# Patient Record
Sex: Male | Born: 1995
Health system: Southern US, Community
[De-identification: ages and names within clinical notes are randomized; demographics above are authoritative.]

## PROBLEM LIST (undated history)

## (undated) ENCOUNTER — Emergency Department (HOSPITAL_COMMUNITY): Payer: No Typology Code available for payment source

## (undated) HISTORY — DX: Morbid (severe) obesity due to excess calories: E66.01

---

## 2014-03-12 ENCOUNTER — Emergency Department: Payer: Self-pay | Admitting: Emergency Medicine

## 2015-04-11 ENCOUNTER — Emergency Department
Admission: EM | Admit: 2015-04-11 | Discharge: 2015-04-11 | Disposition: A | Discharge status: Home / Self Care | Payer: Self-pay | Attending: Student | Admitting: Student

## 2015-04-11 DIAGNOSIS — B081 Molluscum contagiosum: Secondary | ICD-10-CM | POA: Insufficient documentation

## 2015-04-11 NOTE — Discharge Instructions (Signed)
As discussed avoid scratching the areas. Follow up with your primary care physician or dermatology as needed. See above to call to schedule. Return to ER for new or worsening concerns.  Molluscum Contagiosum Molluscum contagiosum is a viral infection of the skin that causes smooth surfaced, firm, small (3 to 5 mm), dome-shaped bumps (papules) which are flesh-colored. The bumps usually do not hurt or itch. In children, they most often appear on the face, trunk, arms and legs. In adults, the growths are commonly found on the genitals, thighs, face, arms, neck, and belly (abdomen). The infection may be spread to others by close (skin to skin) contact (such as occurs in schools and swimming pools), sharing towels and clothing, and through sexual contact. The bumps usually disappear without treatment in 2 to 4 months, especially in children. You may have them treated to avoid spreading them. Scraping (curetting) the middle part (central plug) of the bump with a needle or sharp curette, or application of liquid nitrogen for 8 or 9 seconds usually cures the infection. HOME CARE INSTRUCTIONS   Do not scratch the bumps. This may spread the infection to other parts of the body and to other people.  Avoid close contact with others, including sexual contact, until the bumps disappear. Do not share towels or clothing.  If liquid nitrogen was used, blisters will form. Leave the blisters alone and cover with a bandage. The tops will fall off by themselves in 7 to 14 days.  Four months without a lesion is usually a cure. SEEK IMMEDIATE MEDICAL CARE IF:  You have a fever.  You develop swelling, redness, pain, tenderness, or warmth in the areas of the bumps. They may be infected. Document Released: 09/09/2000 Document Revised: 12/05/2011 Document Reviewed: 02/20/2009 [facility] Patient Information 2015 [place]. This information is not intended to replace advice given to you by your health care provider. Make  sure you discuss any questions you have with your health care provider.

## 2015-04-11 NOTE — ED Provider Notes (Signed)
[facility] Emergency Department Provider Note  ____________________________________________  Time seen: Approximately 12:05 PM  I have reviewed the triage vital signs and the nursing notes.   HISTORY  Chief Complaint rash   HPI Frederick Schmidt is a 19 y.o. male patient presents to ER for the complaints of rash to bilateral forearms for 2-3 weeks. Patient reports that he thought he may have been exposed to something at work as he is frequently around dust and boxes. Patient denies particular onset or trigger. Patient reports that rash started as one to 2 spots on right forearm and then has very slowly progressed. Patient states that the areas do not itch. Denies itching, pain, swelling, irritation. Patient states that "I don't even known that they're there except for that I see them. "Denies others around him with similar.  Patient denies recent change in jobs. Patient states that he has been doing the same job and same exposures for several years. Patient denies changes in foods, medicines, lotions, detergents or other contacts. Denies fevers.    No past medical history on file.  There are no active problems to display for this patient.   No past surgical history on file.  No current outpatient prescriptions on file.  Allergies Review of patient's allergies indicates no known allergies.  No family history on file.  Social History History  Substance Use Topics  . Smoking status: Not on file  . Smokeless tobacco: Not on file  . Alcohol Use: Not on file    Review of Systems Constitutional: No fever/chills Eyes: No visual changes. ENT: No sore throat. Cardiovascular: Denies chest pain. Respiratory: Denies shortness of breath. Gastrointestinal: No abdominal pain.  No nausea, no vomiting.  No diarrhea.  No constipation. Genitourinary: Negative for dysuria. Musculoskeletal: Negative for back pain. Skin: positive for rash. Neurological: Negative for  headaches, focal weakness or numbness.  10-point ROS otherwise negative.  ____________________________________________   PHYSICAL EXAM:  VITAL SIGNS: ED Triage Vitals  Enc Vitals Group     BP --      Pulse -- 88     Resp -- 18     Temp --      Temp src --      SpO2 --      Weight --      Height --      Head Cir --      Peak Flow --      Pain Score --      Pain Loc --      Pain Edu? --      Excl. in GC? --     Constitutional: Alert and oriented. Well appearing and in no acute distress. Eyes: Conjunctivae are normal. PERRL. EOMI. Head: Atraumatic. Nose: No congestion/rhinnorhea. Mouth/Throat: Mucous membranes are moist.  Oropharynx non-erythematous. Neck: No stridor. No cervical spine tenderness to palpation. Hematological/Lymphatic/Immunilogical: No cervical lymphadenopathy. Cardiovascular: Normal rate, regular rhythm. Grossly normal heart sounds.  Good peripheral circulation. Respiratory: Normal respiratory effort.  No retractions. Lungs CTAB. Gastrointestinal: Soft and nontender. No distention. Normal bowel sounds.  Musculoskeletal: No lower or upper extremity tenderness nor edema.  Neurologic:  Normal speech and language. No gross focal neurologic deficits are appreciated. No gait instability. Skin:  Skin is warm, dry and intact. No rash noted. Except:  Bilateral forearms with multiple small firm skin colored dome shaped shiny papules with center indentation. No erythema. No surrounding erythema. No surrounding induration, fluctuance. Nontender. No drainage. Non pruritic. Psychiatric: Mood and affect are normal.  Speech and behavior are normal.  ____________________________________________   LABS (all labs ordered are listed, but only abnormal results are displayed)  Labs Reviewed - No data to display ___________________________________________   INITIAL IMPRESSION / ASSESSMENT AND PLAN / ED COURSE  Pertinent labs & imaging results that were available during  my care of the patient were reviewed by me and considered in my medical decision making (see chart for details).  No acute distress. Very well-appearing patient. Presents the ER for complaints of bilateral forearm rash times several weeks. Patient unsure of trigger. Denies changes of contacts or exposures. Patient bilateral forearm rash appearance consistent with bilateral forearm molluscum contagiosum. Discussed supportive treatment. Discussed avoidance of scratching. Discussed with patient and family to follow up with primary care physician or dermatology as needed. Information also given. Discussed return parameters. Discussed follow-up and return parameters. Patient and spouse verbalized understanding and agreed to plan. ____________________________________________   FINAL CLINICAL IMPRESSION(S) / ED DIAGNOSES  Final diagnoses:  Molluscum contagiosum infection      Renford Dills, NP 04/11/15 1939  Gayla Doss, MD 04/13/15 [phone]

## 2015-04-11 NOTE — ED Notes (Signed)
Patient to ED with c/o rash to bilateral arms for about 2 weeks, thinks he may have hives.

## 2016-08-04 ENCOUNTER — Encounter (HOSPITAL_COMMUNITY): Payer: Self-pay | Admitting: *Deleted

## 2016-08-04 ENCOUNTER — Emergency Department (HOSPITAL_COMMUNITY): Payer: Self-pay

## 2016-08-04 ENCOUNTER — Emergency Department (HOSPITAL_COMMUNITY)
Admission: EM | Admit: 2016-08-04 | Discharge: 2016-08-04 | Disposition: A | Discharge status: Home / Self Care | Payer: Self-pay | Attending: Emergency Medicine | Admitting: Emergency Medicine

## 2016-08-04 DIAGNOSIS — S93402A Sprain of unspecified ligament of left ankle, initial encounter: Secondary | ICD-10-CM | POA: Insufficient documentation

## 2016-08-04 DIAGNOSIS — Y929 Unspecified place or not applicable: Secondary | ICD-10-CM | POA: Insufficient documentation

## 2016-08-04 DIAGNOSIS — Y939 Activity, unspecified: Secondary | ICD-10-CM | POA: Insufficient documentation

## 2016-08-04 DIAGNOSIS — Y990 Civilian activity done for income or pay: Secondary | ICD-10-CM | POA: Insufficient documentation

## 2016-08-04 DIAGNOSIS — X509XXA Other and unspecified overexertion or strenuous movements or postures, initial encounter: Secondary | ICD-10-CM | POA: Insufficient documentation

## 2016-08-04 NOTE — ED Notes (Signed)
Patient transported to X-ray 

## 2016-08-04 NOTE — ED Notes (Signed)
Pt is in stable condition upon d/c and ambulates from ED. coupon given.

## 2016-08-04 NOTE — ED Triage Notes (Signed)
Pt states he slipped at work yesterday and then fell off his stairs this morning and twisted his ankle. There is edema noted to left ankle. Pt states when resting he has no pain, but endorses severe pain with ambulation.

## 2016-08-04 NOTE — ED Provider Notes (Signed)
[facility]-EMERGENCY DEPT Provider Note   CSN: [id number] Arrival date & time: 08/04/16  1040  By signing my name below, I, Frederick Schmidt, attest that this documentation has been prepared under the direction and in the presence of Newell RubbermaidJeffrey Samuell Knoble, PA-C. Electronically Signed: Sonum Schmidt, Neurosurgeoncribe. 08/04/16. 10:56 AM.  History   Chief Complaint Chief Complaint  Patient presents with  . Ankle Pain    The history is provided by the patient. No language interpreter was used.     HPI Comments: Frederick Schmidt is a 20 y.o. male who presents to the Emergency Department complaining of a left ankle and foot pain that began after an injury yesterday at work. Patient states he slipped at work and twisted/rotated the left lower extremity. He states he was fine afterwards but then re-injured the same area yesterday while walking his dog. He states the pain is worse with ambulation.    History reviewed. No pertinent past medical history.  There are no active problems to display for this patient.   History reviewed. No pertinent surgical history.     Home Medications    Prior to Admission medications   Not on File    Family History No family history on file.  Social History Social History  Substance Use Topics  . Smoking status: Not on file  . Smokeless tobacco: Not on file  . Alcohol use Not on file     Allergies   Patient has no known allergies.   Review of Systems Review of Systems  A complete 10 system review of systems was obtained and all systems are negative except as noted in the HPI and PMH.   Physical Exam Updated Vital Signs BP 141/62 (BP Location: Right Arm)   Pulse 71   Temp 98.4 F (36.9 C) (Oral)   Resp 16   SpO2 100%   Physical Exam  Constitutional: He is oriented to person, place, and time. He appears well-developed and well-nourished.  HENT:  Head: Normocephalic and atraumatic.  Cardiovascular: Normal rate and intact distal pulses.   Pulmonary/Chest:  Effort normal.  Musculoskeletal: He exhibits edema and tenderness.  LLE: Obvious swelling to lateral left ankle. Tenderness to lateral malleolus. Tenderness to medial malleolus and proximal foot. Sensation intact. Pedal pulses 2+. No obvious laxity. No pain to remainder of leg  Neurological: He is alert and oriented to person, place, and time. No sensory deficit.  Skin: Skin is warm and dry.  Psychiatric: He has a normal mood and affect.  Nursing note and vitals reviewed.    ED Treatments / Results  DIAGNOSTIC STUDIES: Oxygen Saturation is 100% on RA, normal by my interpretation.    COORDINATION OF CARE: 10:56 AM Discussed treatment plan with pt at bedside and pt agreed to plan.    Labs (all labs ordered are listed, but only abnormal results are displayed) Labs Reviewed - No data to display  EKG  EKG Interpretation None       Radiology Dg Ankle Complete Left  Result Date: 08/04/2016 CLINICAL DATA:  Slipped yesterday twisting the left ankle and foot EXAM: LEFT ANKLE COMPLETE - 3+ VIEW COMPARISON:  None. FINDINGS: The ankle joint appears normal. Alignment is normal. No fracture is seen. IMPRESSION: Negative. Electronically Signed   By: Dwyane DeePaul  Barry M.D.   On: 08/04/2016 11:22   Dg Foot Complete Left  Result Date: 08/04/2016 CLINICAL DATA:  Slipped yesterday twisting the left foot and ankle with pain EXAM: LEFT FOOT - COMPLETE 3+ VIEW COMPARISON:  None. FINDINGS:  Tarsal-metatarsal alignment is normal. No fracture is seen. Joint spaces appear normal. IMPRESSION: Negative. Electronically Signed   By: Dwyane DeePaul  Barry M.D.   On: 08/04/2016 11:23    Procedures Procedures (including critical care time)  Medications Ordered in ED Medications - No data to display   Initial Impression / Assessment and Plan / ED Course  I have reviewed the triage vital signs and the nursing notes.  Pertinent labs & imaging results that were available during my care of the patient were reviewed by me  and considered in my medical decision making (see chart for details).  Clinical Course     Labs:  Imaging:  Consults:  Therapeutics:  Discharge Meds:   Assessment/Plan:  Patient presents with a sprain. She notes no significant laxity, he will be placed in a sewing given crutches. Negative plain films. Discharged home with symptomatic care instructions and follow-up information. He verbalizes understanding and agreement to today's plan Final Clinical Impressions(s) / ED Diagnoses   Final diagnoses:  Sprain of left ankle, unspecified ligament, initial encounter    New Prescriptions There are no discharge medications for this patient.  I personally performed the services described in this documentation, which was scribed in my presence. The recorded information has been reviewed and is accurate.    Eyvonne MechanicJeffrey Kymari Lollis, PA-C 08/04/16 1302    Benjiman CoreNathan Pickering, MD 08/05/16 [phone]

## 2016-08-04 NOTE — Discharge Instructions (Signed)
Please read attached information. If you experience any new or worsening signs or symptoms please return to the emergency room for evaluation. Please follow-up with your primary care provider or specialist as discussed.  °

## 2017-01-18 ENCOUNTER — Encounter (HOSPITAL_COMMUNITY): Payer: Self-pay | Admitting: Emergency Medicine

## 2017-01-18 ENCOUNTER — Emergency Department (HOSPITAL_COMMUNITY)
Admission: EM | Admit: 2017-01-18 | Discharge: 2017-01-18 | Disposition: A | Discharge status: Home / Self Care | Payer: BLUE CROSS/BLUE SHIELD | Attending: Emergency Medicine | Admitting: Emergency Medicine

## 2017-01-18 DIAGNOSIS — R319 Hematuria, unspecified: Secondary | ICD-10-CM | POA: Diagnosis not present

## 2017-01-18 DIAGNOSIS — Z87891 Personal history of nicotine dependence: Secondary | ICD-10-CM | POA: Diagnosis not present

## 2017-01-18 DIAGNOSIS — Z113 Encounter for screening for infections with a predominantly sexual mode of transmission: Secondary | ICD-10-CM | POA: Diagnosis not present

## 2017-01-18 LAB — CBC WITH DIFFERENTIAL/PLATELET
Basophils Absolute: 0 10*3/uL (ref 0.0–0.1)
Basophils Relative: 0 %
EOS ABS: 0.2 10*3/uL (ref 0.0–0.7)
Eosinophils Relative: 4 %
HCT: 44.4 % (ref 39.0–52.0)
HEMOGLOBIN: 14.6 g/dL (ref 13.0–17.0)
LYMPHS ABS: 1.9 10*3/uL (ref 0.7–4.0)
Lymphocytes Relative: 35 %
MCH: 27.2 pg (ref 26.0–34.0)
MCHC: 32.9 g/dL (ref 30.0–36.0)
MCV: 82.8 fL (ref 78.0–100.0)
MONOS PCT: 7 %
Monocytes Absolute: 0.4 10*3/uL (ref 0.1–1.0)
NEUTROS PCT: 54 %
Neutro Abs: 2.9 10*3/uL (ref 1.7–7.7)
Platelets: 239 10*3/uL (ref 150–400)
RBC: 5.36 MIL/uL (ref 4.22–5.81)
RDW: 14.2 % (ref 11.5–15.5)
WBC: 5.5 10*3/uL (ref 4.0–10.5)

## 2017-01-18 LAB — COMPREHENSIVE METABOLIC PANEL
ALK PHOS: 44 U/L (ref 38–126)
ALT: 47 U/L (ref 17–63)
ANION GAP: 6 (ref 5–15)
AST: 86 U/L — ABNORMAL HIGH (ref 15–41)
Albumin: 3.8 g/dL (ref 3.5–5.0)
BILIRUBIN TOTAL: 0.8 mg/dL (ref 0.3–1.2)
BUN: 9 mg/dL (ref 6–20)
CO2: 26 mmol/L (ref 22–32)
Calcium: 9.1 mg/dL (ref 8.9–10.3)
Chloride: 108 mmol/L (ref 101–111)
Creatinine, Ser: 1.27 mg/dL — ABNORMAL HIGH (ref 0.61–1.24)
GFR calc non Af Amer: >60 mL/min (ref >60)
GLUCOSE: 74 mg/dL (ref 65–99)
POTASSIUM: 3.8 mmol/L (ref 3.5–5.1)
SODIUM: 140 mmol/L (ref 135–145)
TOTAL PROTEIN: 6.7 g/dL (ref 6.5–8.1)

## 2017-01-18 LAB — URINALYSIS, ROUTINE W REFLEX MICROSCOPIC
BILIRUBIN URINE: NEGATIVE
GLUCOSE, UA: NEGATIVE mg/dL
HGB URINE DIPSTICK: NEGATIVE
Ketones, ur: NEGATIVE mg/dL
Leukocytes, UA: NEGATIVE
NITRITE: NEGATIVE
PH: 6 (ref 5.0–8.0)
Protein, ur: NEGATIVE mg/dL
SPECIFIC GRAVITY, URINE: 1.023 (ref 1.005–1.030)

## 2017-01-18 MED ORDER — ACETAMINOPHEN 500 MG PO TABS
500.0000 mg | ORAL_TABLET | Freq: Once | ORAL | Status: AC
  Administered 2017-01-18: 500 mg via ORAL
  Filled 2017-01-18: qty 1

## 2017-01-18 NOTE — ED Triage Notes (Addendum)
Pt to ED with c/o seeing blood in his urine this am.  Also st's he has a rash in his private area.  Pt c/o headache and being thirsty all the time

## 2017-01-18 NOTE — Discharge Instructions (Signed)
Log on to the Patient portal in 48hrs to see the results of your test.

## 2017-01-18 NOTE — ED Provider Notes (Signed)
[facility] Provider Note   CSN: [id number] Arrival date & time: 01/18/17  1630     History   Chief Complaint Chief Complaint  Patient presents with  . Hematuria  . Headache    HPI Frederick Schmidt is a 21 y.o. male.  The history is provided by the patient.  Male GU Problem  Primary symptoms include dysuria, genital itching, genital rash, penile pain.  Primary symptoms include no penile discharge, no scrotal pain. Primary symptoms comment: Painless hematuria. This is a new problem. The current episode started 2 days ago. The problem occurs constantly. The problem has not changed since onset.Context: Noticed the hematuria today, so came in for evaluation. Pertinent negatives include no nausea, no vomiting, no abdominal pain and no frequency. There has been no fever. He has tried nothing for the symptoms. Sexual activity: sexually active. He consistently uses condoms. Partner displays symptoms of an STD: no.    History reviewed. No pertinent past medical history.  There are no active problems to display for this patient.   History reviewed. No pertinent surgical history.     Home Medications    Prior to Admission medications   Not on File    Family History No family history on file.  Social History Social History  Substance Use Topics  . Smoking status: Former Games developer  . Smokeless tobacco: Never Used  . Alcohol use Yes     Allergies   Patient has no known allergies.   Review of Systems Review of Systems  Constitutional: Negative for chills and fever.  HENT: Negative for ear pain and sore throat.   Eyes: Negative for pain and visual disturbance.  Respiratory: Negative for cough and shortness of breath.   Cardiovascular: Negative for chest pain and palpitations.  Gastrointestinal: Negative for abdominal pain, nausea and vomiting.  Genitourinary: Positive for dysuria, hematuria and penile pain. Negative for discharge, frequency, penile discharge, penile  swelling, scrotal swelling and testicular pain.  Musculoskeletal: Negative for arthralgias and back pain.  Skin: Negative for color change and rash.  Neurological: Negative for seizures and syncope.  All other systems reviewed and are negative.    Physical Exam Updated Vital Signs BP (!) 150/74   Pulse 62   Temp 98.6 F (37 C) (Oral)   Resp 16   Ht 5' 10.5" (1.791 m)   Wt 127 kg   SpO2 98%   BMI 39.61 kg/m   Physical Exam  Constitutional: He appears well-developed and well-nourished.  HENT:  Head: Normocephalic and atraumatic.  Eyes: Conjunctivae are normal.  Neck: Neck supple.  Cardiovascular: Normal rate and regular rhythm.   No murmur heard. Pulmonary/Chest: Effort normal and breath sounds normal. No respiratory distress.  Abdominal: Soft. There is no tenderness.  Genitourinary: Penis normal.  Genitourinary Comments: TTP over a hair follicle w/o erythema, edema, or other signs of associated infection, no lesions noted on exam, no penile drainage, symmetric testicles w/o tenderenss, no hernias  Musculoskeletal: He exhibits no edema.  Neurological: He is alert.  Skin: Skin is warm and dry.  Psychiatric: He has a normal mood and affect.  Nursing note and vitals reviewed.    ED Treatments / Results  Labs (all labs ordered are listed, but only abnormal results are displayed) Labs Reviewed  COMPREHENSIVE METABOLIC PANEL - Abnormal; Notable for the following:       Result Value   Creatinine, Ser 1.27 (*)    AST 86 (*)    All other components within normal limits  URINALYSIS, ROUTINE W REFLEX MICROSCOPIC  CBC WITH DIFFERENTIAL/PLATELET    EKG  EKG Interpretation None       Radiology No results found.  Procedures Procedures (including critical care time)  Medications Ordered in ED Medications - No data to display   Initial Impression / Assessment and Plan / ED Course  I have reviewed the triage vital signs and the nursing notes.  Pertinent labs &  imaging results that were available during my care of the patient were reviewed by me and considered in my medical decision making (see chart for details).    Pt presents with hematuria. Says he noticed blood in his urine once today; he notes that he's had intermittent pain w/urination for months but says he didn't have any pain today. He also mentions a "rash" on his genitals that has been present for the last 2days; he says he's only had sex w/his girlfriend recently & used a condom. Denies F/C, scrotal pain, penile swelling, discharge, N/V.  VS & exam as above. UA w/o frank or microscopic hematuria here. Crt 1.26, but no labs available for comparisonNo obvious signs of STI on exam. Screening swab for GC/CT collected; will not treat empirically. Cause of he Pt's symptoms unclear, but doubt emergent etiology given history, exam, and workup.  Explained all results to the Pt. Will discharge the Pt home. Recommending follow-up with PCP & Urology if hematuria recurs. ED return precautions provided. Pt acknowledged understanding of, and concurrence with the plan. All questions answered to his satisfaction. In stable condition at the time of discharge.  Final Clinical Impressions(s) / ED Diagnoses   Final diagnoses:  Hematuria, unspecified type  Routine screening for STI (sexually transmitted infection)    New Prescriptions New Prescriptions   No medications on file     Forest Becker, MD 01/19/17 0011    Lavera Guise, MD 01/19/17 [phone]

## 2017-01-20 LAB — GC/CHLAMYDIA PROBE AMP (~~LOC~~) NOT AT ARMC
Chlamydia: NEGATIVE
Neisseria Gonorrhea: NEGATIVE

## 2017-04-13 ENCOUNTER — Encounter (HOSPITAL_COMMUNITY): Payer: Self-pay | Admitting: Emergency Medicine

## 2017-04-13 ENCOUNTER — Emergency Department (HOSPITAL_COMMUNITY): Payer: BLUE CROSS/BLUE SHIELD

## 2017-04-13 ENCOUNTER — Emergency Department (HOSPITAL_COMMUNITY)
Admission: EM | Admit: 2017-04-13 | Discharge: 2017-04-14 | Disposition: A | Discharge status: Home / Self Care | Payer: BLUE CROSS/BLUE SHIELD | Attending: Emergency Medicine | Admitting: Emergency Medicine

## 2017-04-13 DIAGNOSIS — M545 Low back pain, unspecified: Secondary | ICD-10-CM

## 2017-04-13 DIAGNOSIS — Z87891 Personal history of nicotine dependence: Secondary | ICD-10-CM | POA: Insufficient documentation

## 2017-04-13 DIAGNOSIS — M546 Pain in thoracic spine: Secondary | ICD-10-CM

## 2017-04-13 DIAGNOSIS — Y998 Other external cause status: Secondary | ICD-10-CM | POA: Diagnosis not present

## 2017-04-13 DIAGNOSIS — Y92410 Unspecified street and highway as the place of occurrence of the external cause: Secondary | ICD-10-CM | POA: Diagnosis not present

## 2017-04-13 DIAGNOSIS — M549 Dorsalgia, unspecified: Secondary | ICD-10-CM | POA: Diagnosis not present

## 2017-04-13 DIAGNOSIS — Y939 Activity, unspecified: Secondary | ICD-10-CM | POA: Diagnosis not present

## 2017-04-13 MED ORDER — METHOCARBAMOL 500 MG PO TABS: 500.0000 mg | ORAL_TABLET | Freq: Two times a day (BID) | ORAL | 0 refills | Status: DC

## 2017-04-13 MED ORDER — IBUPROFEN 800 MG PO TABS: 800.0000 mg | ORAL_TABLET | Freq: Three times a day (TID) | ORAL | 0 refills | Status: DC
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
## 2017-04-13 NOTE — ED Notes (Addendum)
Pt updated on plan of care and delay. Pt verbalized understanding.
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
## 2017-04-13 NOTE — Discharge Instructions (Signed)
Please read attached information. If you experience any new or worsening signs or symptoms please return to the emergency room for evaluation. Please follow-up with your primary care provider or specialist as discussed. Please use medication prescribed only as directed and discontinue taking if you have any concerning signs or symptoms.   °

## 2017-04-13 NOTE — ED Notes (Signed)
Patient transported to X-ray 
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
## 2017-09-07 ENCOUNTER — Other Ambulatory Visit: Payer: Self-pay

## 2017-09-07 ENCOUNTER — Encounter (HOSPITAL_COMMUNITY): Payer: Self-pay | Admitting: Emergency Medicine

## 2017-09-07 ENCOUNTER — Emergency Department (HOSPITAL_COMMUNITY)
Admission: EM | Admit: 2017-09-07 | Discharge: 2017-09-07 | Disposition: A | Discharge status: Home / Self Care | Payer: BLUE CROSS/BLUE SHIELD | Attending: Emergency Medicine | Admitting: Emergency Medicine

## 2017-09-07 DIAGNOSIS — Z5321 Procedure and treatment not carried out due to patient leaving prior to being seen by health care provider: Secondary | ICD-10-CM | POA: Diagnosis not present

## 2017-09-07 DIAGNOSIS — R51 Headache: Secondary | ICD-10-CM | POA: Diagnosis not present

## 2017-09-07 LAB — COMPREHENSIVE METABOLIC PANEL
ALK PHOS: 46 U/L (ref 38–126)
ALT: 31 U/L (ref 17–63)
AST: 34 U/L (ref 15–41)
Albumin: 4.1 g/dL (ref 3.5–5.0)
Anion gap: 6 (ref 5–15)
BUN: 9 mg/dL (ref 6–20)
CALCIUM: 9.1 mg/dL (ref 8.9–10.3)
CHLORIDE: 106 mmol/L (ref 101–111)
CO2: 24 mmol/L (ref 22–32)
CREATININE: 1.19 mg/dL (ref 0.61–1.24)
Glucose, Bld: 118 mg/dL — ABNORMAL HIGH (ref 65–99)
Potassium: 3.8 mmol/L (ref 3.5–5.1)
Sodium: 136 mmol/L (ref 135–145)
TOTAL PROTEIN: 7 g/dL (ref 6.5–8.1)
Total Bilirubin: 0.7 mg/dL (ref 0.3–1.2)

## 2017-09-07 LAB — URINALYSIS, ROUTINE W REFLEX MICROSCOPIC
BILIRUBIN URINE: NEGATIVE
Glucose, UA: NEGATIVE mg/dL
HGB URINE DIPSTICK: NEGATIVE
KETONES UR: NEGATIVE mg/dL
NITRITE: NEGATIVE
PROTEIN: NEGATIVE mg/dL
Specific Gravity, Urine: 1.023 (ref 1.005–1.030)
pH: 6 (ref 5.0–8.0)

## 2017-09-07 LAB — CBC
HEMATOCRIT: 43.2 % (ref 39.0–52.0)
HEMOGLOBIN: 14.5 g/dL (ref 13.0–17.0)
MCH: 27.9 pg (ref 26.0–34.0)
MCHC: 33.6 g/dL (ref 30.0–36.0)
MCV: 83.1 fL (ref 78.0–100.0)
Platelets: 240 10*3/uL (ref 150–400)
RBC: 5.2 MIL/uL (ref 4.22–5.81)
RDW: 14.3 % (ref 11.5–15.5)
WBC: 6.7 10*3/uL (ref 4.0–10.5)

## 2017-09-07 LAB — LIPASE, BLOOD: LIPASE: 31 U/L (ref 11–51)

## 2017-09-07 NOTE — ED Notes (Signed)
Pt does not answer when called in the waiting area x 3

## 2017-09-07 NOTE — ED Triage Notes (Signed)
Patient reports emesis with diarrhea and headache onset yesterday , denies fever or chills .

## 2017-10-04 ENCOUNTER — Encounter (HOSPITAL_COMMUNITY): Payer: Self-pay | Admitting: *Deleted

## 2017-10-04 ENCOUNTER — Emergency Department (HOSPITAL_COMMUNITY)
Admission: EM | Admit: 2017-10-04 | Discharge: 2017-10-04 | Disposition: A | Discharge status: Home / Self Care | Payer: Managed Care, Other (non HMO) | Attending: Emergency Medicine | Admitting: Emergency Medicine

## 2017-10-04 DIAGNOSIS — Z87891 Personal history of nicotine dependence: Secondary | ICD-10-CM | POA: Insufficient documentation

## 2017-10-04 DIAGNOSIS — L0291 Cutaneous abscess, unspecified: Secondary | ICD-10-CM

## 2017-10-04 DIAGNOSIS — Z23 Encounter for immunization: Secondary | ICD-10-CM | POA: Insufficient documentation

## 2017-10-04 DIAGNOSIS — L03116 Cellulitis of left lower limb: Secondary | ICD-10-CM

## 2017-10-04 DIAGNOSIS — Z79899 Other long term (current) drug therapy: Secondary | ICD-10-CM | POA: Insufficient documentation

## 2017-10-04 MED ORDER — DOXYCYCLINE HYCLATE 100 MG PO CAPS: 100.0000 mg | ORAL_CAPSULE | Freq: Two times a day (BID) | ORAL | 0 refills | Status: DC

## 2017-10-04 MED ORDER — LIDOCAINE-EPINEPHRINE (PF) 2 %-1:200000 IJ SOLN
10.0000 mL | Freq: Once | INTRAMUSCULAR | Status: AC
  Administered 2017-10-04: 10 mL
  Filled 2017-10-04: qty 20

## 2017-10-04 MED ORDER — IBUPROFEN 800 MG PO TABS: 800.0000 mg | ORAL_TABLET | Freq: Three times a day (TID) | ORAL | 0 refills | Status: DC

## 2017-10-04 MED ORDER — TETANUS-DIPHTH-ACELL PERTUSSIS 5-2.5-18.5 LF-MCG/0.5 IM SUSP
0.5000 mL | Freq: Once | INTRAMUSCULAR | Status: AC
  Administered 2017-10-04: 0.5 mL via INTRAMUSCULAR
  Filled 2017-10-04: qty 0.5

## 2017-10-04 NOTE — Discharge Instructions (Signed)
Please read and follow all provided instructions.  You were seen here today for an Abscess. For this, an incision and drainage (aka an I&D) to the affected area was done today. An I&D is a surgical procedure to open and drain a fluid-filled sac that may be filled with pus, mucus, or blood. Examples of fluid-filled sacs that may need surgical drainage include cysts, skin infections (abscesses), and red lumps that develop from a ruptured cyst or a small abscess (boils).  You were also found to have an area of cellulitis surrounding this for which you are being prescribed antibiotics. Please see attached handout.   Home instructions  1. Medications: Doxycycline. Please not this medication can cause light sensitivity. Please take all of your antibiotics until finished!   You may develop abdominal discomfort or diarrhea from the antibiotic.  You may help offset this with probiotics which you can buy or get in yogurt. Do not eat or take the probiotics until 2 hours after your antibiotic. Do not take your medicine if develop an itchy rash, swelling in your mouth or lips, or difficulty breathing.   2. Treatment: Keep wound clean and dry. Apply warm compresses to  throughout the day. It will continue to drain over the follow days.  For pain control you may take: 800mg  of ibuprofen (that is usually four 200mg  over the counter pills) up to 3 times a day (please take with food) and acetaminophen 975mg  (this is 3 normal strength, 325mg , over the counter pills) up to four times a day. Please do not take more than this. Do not drink alcohol or combine with other medications that have acetaminophen as an ingredient (Read the labels!).    Follow Up:  [facility] Provider [facility] wound recheck .  Return to emergency department for emergent changing or worsening symptoms.  Return instructions:  Return to the Emergency Department if you have: 1. Fever 2. You have  more redness, swelling, or pain around your incision.  3. Your incision feels warm to touch 4. Redness of the skin that moves away from the affected area, especially if it streaks away from the affected area  5. The area where the incision and drainage occurred becomes numb or it tingles. 6. Any other emergent concerns Contact a doctor if:  You have a fever.  Your symptoms do not get better after 1-2 days of treatment.  Your bone or joint under the infected area starts to hurt after the skin has healed.  Your infection comes back. This can happen in the same area or another area.  You have a swollen bump in the infected area.  You have new symptoms.  You feel ill and also have muscle aches and pains. Get help right away if:  Your symptoms get worse.  You feel very sleepy.  You throw up (vomit) or have watery poop (diarrhea) for a long time.  There are red streaks coming from the infected area.  Your red area gets larger.  Your red area turns darker.  Your vital signs today were: BP (!) 157/88 (BP Location: Right Arm)    Pulse 66    Temp 98.4 F (36.9 C) (Oral)    Resp 14    SpO2 100%  If your blood pressure (BP) was elevated above 135/85 this visit, please have this repeated by your doctor within one month. ---------------

## 2017-10-04 NOTE — ED Triage Notes (Signed)
To ED for eval of abscess to left upper thigh for past 2 days. States it started as a pimple and just got worse. States he had same on arm that resolved on own. Drainage, swelling, and redness noted.

## 2017-10-04 NOTE — ED Provider Notes (Signed)
[facility] EMERGENCY DEPARTMENT Provider Note   CSN: [id number] Arrival date & time: 10/04/17  4098     History   Chief Complaint Chief Complaint  Patient presents with  . Abscess    HPI Frederick Schmidt is a 22 y.o. male with no significant past medical history who presents emergency department today for possible abscess.  Patient states that he believes he was bitten by a bug on his left upper leg approximately 2 days ago.  Since then the area has become more red, hot and started draining a whitish material.  He has not done in triage interventions for this.  He denies any fever, chills, IV drug use, recent tick bites, history of diabetes, chronic steroid use or other immunosuppressive medications.  HPI  History reviewed. No pertinent past medical history.  There are no active problems to display for this patient.   History reviewed. No pertinent surgical history.     Home Medications    Prior to Admission medications   Medication Sig Start Date End Date Taking? Authorizing Provider  ibuprofen (ADVIL,MOTRIN) 800 MG tablet Take 1 tablet (800 mg total) by mouth 3 (three) times daily. 04/13/17   Hedges, Tinnie Gens, PA-C  methocarbamol (ROBAXIN) 500 MG tablet Take 1 tablet (500 mg total) by mouth 2 (two) times daily. 04/13/17   Eyvonne Mechanic, PA-C    Family History No family history on file.  Social History Social History   Tobacco Use  . Smoking status: Former Games developer  . Smokeless tobacco: Never Used  Substance Use Topics  . Alcohol use: Yes  . Drug use: Yes    Types: Marijuana     Allergies   Shellfish allergy   Review of Systems Review of Systems  All other systems reviewed and are negative.    Physical Exam Updated Vital Signs BP (!) 157/88 (BP Location: Right Arm)   Pulse 66   Temp 98.4 F (36.9 C) (Oral)   Resp 14   SpO2 100%   Physical Exam  Constitutional: He appears well-developed and well-nourished.  HENT:  Head:  Normocephalic and atraumatic.  Right Ear: External ear normal.  Left Ear: External ear normal.  Eyes: Conjunctivae are normal. Right eye exhibits no discharge. Left eye exhibits no discharge. No scleral icterus.  Pulmonary/Chest: Effort normal. No respiratory distress.  Neurological: He is alert.  Skin: No pallor.  On the left inner, upper thigh there is a focal area where scab is present.  Possible fluctuance noted over this area.  Dried, whitish material from assumes prior drainage is present.  There is noted 2 cm of induration surrounding the scab.  There is 9 cm x 7 cm area of erythema, warmth overlying this.  This does not extend into the inguinal fold or to the scrotum.  Area is tender to touch.  Not out of proportion.  Psychiatric: He has a normal mood and affect.  Nursing note and vitals reviewed.    ED Treatments / Results  Labs (all labs ordered are listed, but only abnormal results are displayed) Labs Reviewed - No data to display  EKG  EKG Interpretation None       Radiology No results found.  Procedures .Marland KitchenIncision and Drainage Date/Time: 10/04/2017 10:39 AM Performed by: Jacinto Halim, PA-C Authorized by: Jacinto Halim, PA-C   Consent:    Consent obtained:  Verbal   Consent given by:  Patient   Risks discussed:  Bleeding, incomplete drainage and pain   Alternatives discussed:  No treatment Location:    Type:  Abscess   Size:  1cm   Location:  Lower extremity   Lower extremity location:  Leg   Leg location:  L upper leg Pre-procedure details:    Skin preparation:  Betadine Anesthesia (see MAR for exact dosages):    Anesthesia method:  Local infiltration   Local anesthetic:  Lidocaine 2% WITH epi Procedure type:    Complexity:  Simple Procedure details:    Incision types:  Stab incision   Scalpel blade:  11   Wound management:  Probed and deloculated   Drainage:  Purulent and bloody   Drainage amount:  Moderate   Wound treatment:  Wound left  open   Packing materials:  None Post-procedure details:    Patient tolerance of procedure:  Tolerated well, no immediate complications   (including critical care time)  Medications Ordered in ED Medications  lidocaine-EPINEPHrine (XYLOCAINE W/EPI) 2 %-1:200000 (PF) injection 10 mL (not administered)  Tdap (BOOSTRIX) injection 0.5 mL (not administered)     Initial Impression / Assessment and Plan / ED Course  I have reviewed the triage vital signs and the nursing notes.  Pertinent labs & imaging results that were available during my care of the patient were reviewed by me and considered in my medical decision making (see chart for details).     22 y.o. male presenting with area of redness, heat and drainage of left upper leg over the last 2 days.  No history of immunocompromise.  No risk factors for HIV.  No history of diabetes or immunosuppressive medications.  Patient's exam with 9 cm x 7 cm area of erythema, warmth with centralized area of induration and fluctuance.  There is a scab over the area of fluctuance with dried drainage from prior.  I probed this with a Q-tip and it does not appear to be open any longer.  Bedside ultrasound performed and appears to have a collection of fluid underneath the skin that would be amenable to incision and drainage.  Abscess was not large enough to warrant packing or drain.  He is to follow-up for wound recheck in 2 days. Encouraged home warm soaks and flushing.  Will discharge home on antibiotics for surrounding cellulitis.   Area marked and pt encouraged to return if redness begins to streak, extends beyond the markings, and/or fever or nausea/vomiting develop.  Pt is alert, oriented, NAD, afebrile, non tachycardic, nonseptic and nontoxic appearing.  He is understanding and agreement with plan.  Patient appears safe for discharge.  Final Clinical Impressions(s) / ED Diagnoses   Final diagnoses:  Abscess  Cellulitis of left lower extremity    ED  Discharge Orders        Ordered    doxycycline (VIBRAMYCIN) 100 MG capsule  2 times daily     10/04/17 1048    ibuprofen (ADVIL,MOTRIN) 800 MG tablet  3 times daily     10/04/17 1048       Princella PellegriniMaczis, Eunice Oldaker M, PA-C 10/04/17 1048    Rolland PorterJames, Mark, MD 10/06/17 [id number]

## 2018-04-02 ENCOUNTER — Other Ambulatory Visit: Payer: Self-pay

## 2018-04-02 ENCOUNTER — Ambulatory Visit (HOSPITAL_COMMUNITY)
Admission: EM | Admit: 2018-04-02 | Discharge: 2018-04-02 | Disposition: A | Discharge status: Home / Self Care | Payer: Managed Care, Other (non HMO) | Attending: Family Medicine | Admitting: Family Medicine

## 2018-04-02 ENCOUNTER — Encounter (HOSPITAL_COMMUNITY): Payer: Self-pay | Admitting: *Deleted

## 2018-04-02 DIAGNOSIS — F17200 Nicotine dependence, unspecified, uncomplicated: Secondary | ICD-10-CM | POA: Insufficient documentation

## 2018-04-02 DIAGNOSIS — R369 Urethral discharge, unspecified: Secondary | ICD-10-CM

## 2018-04-02 DIAGNOSIS — Z113 Encounter for screening for infections with a predominantly sexual mode of transmission: Secondary | ICD-10-CM

## 2018-04-02 DIAGNOSIS — R300 Dysuria: Secondary | ICD-10-CM

## 2018-04-02 DIAGNOSIS — Z79899 Other long term (current) drug therapy: Secondary | ICD-10-CM | POA: Insufficient documentation

## 2018-04-02 LAB — POCT URINALYSIS DIP (DEVICE)
BILIRUBIN URINE: NEGATIVE
GLUCOSE, UA: NEGATIVE mg/dL
Nitrite: NEGATIVE
Protein, ur: NEGATIVE mg/dL
Urobilinogen, UA: 1 mg/dL (ref 0.0–1.0)
pH: 5.5 (ref 5.0–8.0)

## 2018-04-02 MED ORDER — AZITHROMYCIN 250 MG PO TABS
1000.0000 mg | ORAL_TABLET | Freq: Once | ORAL | Status: AC
  Administered 2018-04-02: 1000 mg via ORAL

## 2018-04-02 MED ORDER — AZITHROMYCIN 250 MG PO TABS
ORAL_TABLET | ORAL | Status: AC
Start: 2018-04-02 — End: ?
  Filled 2018-04-02: qty 4

## 2018-04-02 MED ORDER — LIDOCAINE HCL (PF) 1 % IJ SOLN
INTRAMUSCULAR | Status: AC
  Filled 2018-04-02: qty 2

## 2018-04-02 MED ORDER — CEFTRIAXONE SODIUM 250 MG IJ SOLR
250.0000 mg | Freq: Once | INTRAMUSCULAR | Status: AC
  Administered 2018-04-02: 250 mg via INTRAMUSCULAR

## 2018-04-02 MED ORDER — CEFTRIAXONE SODIUM 250 MG IJ SOLR
INTRAMUSCULAR | Status: AC
  Filled 2018-04-02: qty 250

## 2018-04-02 NOTE — Discharge Instructions (Signed)
Please withhold from intercourse for the next week. Please use condoms to prevent STD's.   Will notify you of any positive findings and if any changes to treatment are needed.   

## 2018-04-02 NOTE — ED Triage Notes (Signed)
C/o penile discharge onset yest.

## 2018-04-02 NOTE — ED Provider Notes (Signed)
[facility]    CSN: [id number] Arrival date & time: 04/02/18  [id number]     History   Chief Complaint Chief Complaint  Patient presents with  . Penile Discharge    HPI Frederick Schmidt is a 22 y.o. male.   Frederick Schmidt presents with complaints of penile discharge which is yellow, he noticed it first yesterday. Slight burning with urination. No frequency, abdominal pain or back pain. No fevers. No penile sores or lesions. No blood to urine or from penis. Has multiple sexual partners and does not always use condoms. No specific known std exposure. Denies any previous similar or previous std. Without contributing medical history.      ROS per HPI.      History reviewed. No pertinent past medical history.  There are no active problems to display for this patient.   History reviewed. No pertinent surgical history.     Home Medications    Prior to Admission medications   Medication Sig Start Date End Date Taking? Authorizing Provider  doxycycline (VIBRAMYCIN) 100 MG capsule Take 1 capsule (100 mg total) by mouth 2 (two) times daily. 10/04/17   Maczis, Elmer SowMichael M, PA-C  ibuprofen (ADVIL,MOTRIN) 800 MG tablet Take 1 tablet (800 mg total) by mouth 3 (three) times daily. 10/04/17   Maczis, Elmer SowMichael M, PA-C  methocarbamol (ROBAXIN) 500 MG tablet Take 1 tablet (500 mg total) by mouth 2 (two) times daily. 04/13/17   Eyvonne MechanicHedges, Jeffrey, PA-C    Family History No family history on file.  Social History Social History   Tobacco Use  . Smoking status: Current Some Day Smoker  . Smokeless tobacco: Never Used  Substance Use Topics  . Alcohol use: Yes  . Drug use: Yes    Types: Marijuana     Allergies   Shellfish allergy   Review of Systems Review of Systems   Physical Exam Triage Vital Signs ED Triage Vitals  Enc Vitals Group     BP 04/02/18 0930 (!) 152/76     Pulse Rate 04/02/18 0930 68     Resp 04/02/18 0930 16     Temp 04/02/18 0930 98.1 F (36.7 C)     Temp  Source 04/02/18 0930 Oral     SpO2 04/02/18 0930 99 %     Weight 04/02/18 0930 280 lb (127 kg)     Height --      Head Circumference --      Peak Flow --      Pain Score 04/02/18 0933 6     Pain Loc --      Pain Edu? --      Excl. in GC? --    No data found.  Updated Vital Signs BP (!) 152/76 (BP Location: Left Arm)   Pulse 68   Temp 98.1 F (36.7 C) (Oral)   Resp 16   Wt 280 lb (127 kg)   SpO2 99%   BMI 39.61 kg/m    Physical Exam  Constitutional: He is oriented to person, place, and time. He appears well-developed and well-nourished.  Cardiovascular: Normal rate and regular rhythm.  Pulmonary/Chest: Effort normal and breath sounds normal.  Abdominal: There is no tenderness.  Genitourinary:  Genitourinary Comments: Denies sores, lesions, or ulcers, exam deferred at this time   Neurological: He is alert and oriented to person, place, and time.  Skin: Skin is warm and dry.     UC Treatments / Results  Labs (all labs ordered are listed, but only abnormal results  are displayed) Labs Reviewed  POCT URINALYSIS DIP (DEVICE) - Abnormal; Notable for the following components:      Result Value   Ketones, ur TRACE (*)    Hgb urine dipstick TRACE (*)    Leukocytes, UA LARGE (*)    All other components within normal limits  URINE CYTOLOGY ANCILLARY ONLY    EKG None  Radiology No results found.  Procedures Procedures (including critical care time)  Medications Ordered in UC Medications  azithromycin (ZITHROMAX) tablet 1,000 mg (1,000 mg Oral Given 04/02/18 0952)  cefTRIAXone (ROCEPHIN) injection 250 mg (250 mg Intramuscular Given 04/02/18 0952)    Initial Impression / Assessment and Plan / UC Course  I have reviewed the triage vital signs and the nursing notes.  Pertinent labs & imaging results that were available during my care of the patient were reviewed by me and considered in my medical decision making (see chart for details).     Empiric treatment with  azithromycin and rocephin provided at this time. Urine cytology pending. Culture obtained from urine as well due to leuks and hgb, history remains likely treated with above medications,will await culture for further medication administration if needed.  Safe sex practices discussed and encouraged. Patient verbalized understanding and agreeable to plan.    Final Clinical Impressions(s) / UC Diagnoses   Final diagnoses:  Penile discharge     Discharge Instructions     Please withhold from intercourse for the next week. Please use condoms to prevent STD's.   Will notify you of any positive findings and if any changes to treatment are needed.      ED Prescriptions    None     Controlled Substance Prescriptions [place] Controlled Substance Registry consulted? Not Applicable   Georgetta Haber, NP 04/02/18 1005

## 2018-04-03 ENCOUNTER — Telehealth (HOSPITAL_COMMUNITY): Payer: Self-pay

## 2018-04-03 LAB — URINE CYTOLOGY ANCILLARY ONLY
Chlamydia: POSITIVE — AB
NEISSERIA GONORRHEA: POSITIVE — AB
Trichomonas: NEGATIVE

## 2018-04-03 NOTE — Telephone Encounter (Signed)
Chlamydia is positive.  This was treated at the urgent care visit with po zithromax 1g.  Pt contacted and made aware of results. Educated pt to please refrain from sexual intercourse for 7 days to give the medicine time to work.  Sexual partners need to be notified and tested/treated.  Condoms may reduce risk of reinfection.  Recheck or followup with PCP for further evaluation if symptoms are not improving.  [facility] notified  Test for gonorrhea was positive. This was treated at the urgent care visit with IM rocephin 250mg and po zithromax 1g. Pt called regarding test results, instructed patient to refrain from sexual intercourse for 7 days after treatment to give the medicine time to work. Sexual partners need to be notified and tested/treated. Condoms may reduce risk of reinfection. Recheck or followup with PCP for further evaluation if symptoms are not improving. Answered all patient questions. [facility] notified.  

## 2019-03-03 ENCOUNTER — Other Ambulatory Visit: Payer: Self-pay

## 2019-03-03 ENCOUNTER — Encounter (HOSPITAL_COMMUNITY): Payer: Self-pay | Admitting: *Deleted

## 2019-03-03 ENCOUNTER — Emergency Department (HOSPITAL_COMMUNITY)
Admission: EM | Admit: 2019-03-03 | Discharge: 2019-03-03 | Disposition: A | Discharge status: Home / Self Care | Payer: Managed Care, Other (non HMO) | Attending: Emergency Medicine | Admitting: Emergency Medicine

## 2019-03-03 DIAGNOSIS — Z113 Encounter for screening for infections with a predominantly sexual mode of transmission: Secondary | ICD-10-CM | POA: Insufficient documentation

## 2019-03-03 DIAGNOSIS — Z87891 Personal history of nicotine dependence: Secondary | ICD-10-CM | POA: Insufficient documentation

## 2019-03-03 DIAGNOSIS — R1031 Right lower quadrant pain: Secondary | ICD-10-CM | POA: Insufficient documentation

## 2019-03-03 DIAGNOSIS — N4889 Other specified disorders of penis: Secondary | ICD-10-CM | POA: Insufficient documentation

## 2019-03-03 DIAGNOSIS — R590 Localized enlarged lymph nodes: Secondary | ICD-10-CM | POA: Insufficient documentation

## 2019-03-03 LAB — URINALYSIS, ROUTINE W REFLEX MICROSCOPIC
Bacteria, UA: NONE SEEN
Bilirubin Urine: NEGATIVE
Glucose, UA: NEGATIVE mg/dL
Hgb urine dipstick: NEGATIVE
Ketones, ur: NEGATIVE mg/dL
Leukocytes,Ua: NEGATIVE
Nitrite: NEGATIVE
Protein, ur: NEGATIVE mg/dL
Specific Gravity, Urine: 1.021 (ref 1.005–1.030)
pH: 5 (ref 5.0–8.0)

## 2019-03-03 MED ORDER — CEFTRIAXONE SODIUM 250 MG IJ SOLR
250.0000 mg | Freq: Once | INTRAMUSCULAR | Status: AC
  Administered 2019-03-03: 250 mg via INTRAMUSCULAR
  Filled 2019-03-03: qty 250

## 2019-03-03 MED ORDER — AZITHROMYCIN 250 MG PO TABS
1000.0000 mg | ORAL_TABLET | Freq: Once | ORAL | Status: AC
  Administered 2019-03-03: 1000 mg via ORAL
  Filled 2019-03-03: qty 4

## 2019-03-03 NOTE — Discharge Instructions (Signed)
Your urine test was normal.   Can take tylenol/ibuprofen as needed for pain. Can also apply ice to area of swelling 20 min at a time, 2-3x daily as needed for pain and swelling.   Follow up with [facility] STD clinic to be screened for HIV in the future and for future STD concerns or screenings. This is the recommendation by the CDC for people with multiple sexual partners or hx of STDs. You have been treated for gonorrhea and chlamydia in the ER but the hospital will call you if lab is positive.  Do not have sexual intercourse for 7 days after antibiotic treatment.

## 2019-03-03 NOTE — ED Provider Notes (Signed)
[facility]   CSN: [id number] Arrival date & time: 03/03/19  0706    History   Chief Complaint Chief Complaint  Patient presents with  . Groin Pain    HPI Frederick Schmidt is a 23 y.o. male presenting for evaluation of acute onset, persistent right groin swelling noted this morning.  He does note that last night when he went to bed he experienced some mild right groin discomfort and a few days ago he noticed that his urine was "reddish".  This morning upon awakening he noticed that the swelling had worsened a bit.  He notes some mild pain to the area which he notices when he walks.  Denies injury.  He denies dysuria, urgency, frequency, urethral discharge, testicular pain, scrotal swelling, or pain with bowel movements.  He denies abdominal pain, nausea, vomiting, or fevers.  He has not tried anything for his symptoms.  He reports that he has had several new male sexual partners since the last time he got tested for STDs, does not know exactly how many, and does not always use protection.      The history is provided by the patient.    History reviewed. No pertinent past medical history.  There are no active problems to display for this patient.   History reviewed. No pertinent surgical history.      Home Medications    Prior to Admission medications   Medication Sig Start Date End Date Taking? Authorizing Provider  doxycycline (VIBRAMYCIN) 100 MG capsule Take 1 capsule (100 mg total) by mouth 2 (two) times daily. 10/04/17   Maczis, Elmer SowMichael M, PA-C  ibuprofen (ADVIL,MOTRIN) 800 MG tablet Take 1 tablet (800 mg total) by mouth 3 (three) times daily. 10/04/17   Maczis, Elmer SowMichael M, PA-C  methocarbamol (ROBAXIN) 500 MG tablet Take 1 tablet (500 mg total) by mouth 2 (two) times daily. 04/13/17   Eyvonne MechanicHedges, Jeffrey, PA-C    Family History No family history on file.  Social History Social History   Tobacco Use  . Smoking status:  Former Games developermoker  . Smokeless tobacco: Never Used  Substance Use Topics  . Alcohol use: Yes  . Drug use: Yes    Types: Marijuana     Allergies   Shellfish allergy   Review of Systems Review of Systems  Constitutional: Negative for chills and fever.  Respiratory: Negative for shortness of breath.   Cardiovascular: Negative for chest pain.  Gastrointestinal: Negative for abdominal pain, nausea and vomiting.  Genitourinary: Positive for penile pain and penile swelling. Negative for difficulty urinating, discharge, dysuria, flank pain, genital sores, scrotal swelling, testicular pain and urgency.  All other systems reviewed and are negative.    Physical Exam Updated Vital Signs BP (!) 149/89 (BP Location: Right Arm)   Pulse 75   Temp 98.7 F (37.1 C) (Oral)   Resp 20   Ht 5\' 10"  (1.778 m)   Wt 127 kg   SpO2 97%   BMI 40.18 kg/m   Physical Exam Vitals signs and nursing note reviewed. Exam conducted with a chaperone present.  Constitutional:      General: He is not in acute distress.    Appearance: He is well-developed.     Comments: Resting comfortably in bed  HENT:     Head: Normocephalic and atraumatic.  Eyes:     General:        Right eye: No discharge.        Left eye: No discharge.  Conjunctiva/sclera: Conjunctivae normal.  Neck:     Vascular: No JVD.     Trachea: No tracheal deviation.  Cardiovascular:     Rate and Rhythm: Normal rate.  Pulmonary:     Effort: Pulmonary effort is normal.  Abdominal:     General: Abdomen is flat. There is no distension.     Palpations: Abdomen is soft.     Tenderness: There is no abdominal tenderness. There is no right CVA tenderness, left CVA tenderness, guarding or rebound.  Genitourinary:    Penis: Circumcised. Tenderness present. No phimosis, paraphimosis, erythema or discharge.      Scrotum/Testes: Normal.        Right: Tenderness or swelling not present.        Left: Tenderness or swelling not present.      Epididymis:     Right: Normal.     Left: Normal.     Comments: Examination performed in the presence of a chaperone. R inguinal lymphadenopathy noted, very mild swelling to right side of penile shaft. No erythema or induration. Mildly tender to palpation. No abnormalities to the glans penis, no urethral discharge.  Lymphadenopathy:     Lower Body: Right inguinal adenopathy present.  Skin:    General: Skin is warm and dry.     Findings: No erythema.  Neurological:     Mental Status: He is alert.  Psychiatric:        Behavior: Behavior normal.      ED Treatments / Results  Labs (all labs ordered are listed, but only abnormal results are displayed) Labs Reviewed  URINE CULTURE  URINALYSIS, ROUTINE W REFLEX MICROSCOPIC  RPR  HIV ANTIBODY (ROUTINE TESTING W REFLEX)  GC/CHLAMYDIA PROBE AMP ([place]) NOT AT [facility]    EKG None  Radiology No results found.  Procedures Procedures (including critical care time)  Medications Ordered in ED Medications  cefTRIAXone (ROCEPHIN) injection 250 mg (250 mg Intramuscular Given 03/03/19 0754)  azithromycin (ZITHROMAX) tablet 1,000 mg (1,000 mg Oral Given 03/03/19 0754)     Initial Impression / Assessment and Plan / ED Course  I have reviewed the triage vital signs and the nursing notes.  Pertinent labs & imaging results that were available during my care of the patient were reviewed by me and considered in my medical decision making (see chart for details).        Patient presenting for evaluation of right groin pain and mild right-sided penile swelling.  Patient is afebrile without abdominal tenderness, abdominal pain or painful bowel movements to indicate prostatitis.  No tenderness to palpation of the testes or epididymis to suggest orchitis or epididymitis.  No evidence of phimosis or paraphimosis. No genital lesions. He does have right sided lymphadenopathy on exam. He has been seen int he ED and urgent care frequently for STD  testing/treatment. STD cultures obtained including HIV, syphilis, gonorrhea and chlamydia. Patient to be discharged with instructions to follow up with PCP. Discussed importance of using protection when sexually active. Patient understands that they have GC/Chlamydia cultures pending and that they will need to inform all sexual partners if results return positive. Patient has been treated prophylactically with azithromycin and Rocephin. Discussed strict ED return precautions. Patient verbalized understanding of and agreement with plan and is safe for discharge home at this time.    Final Clinical Impressions(s) / ED Diagnoses   Final diagnoses:  Right groin pain  Penile swelling    ED Discharge Orders    None  Jeanie SewerFawze, Justyce Baby A, PA-C 03/03/19 [phone]    Linwood DibblesKnapp, Jon, MD 03/05/19 [phone]

## 2019-03-03 NOTE — ED Triage Notes (Signed)
States he woke up this am and had swelling to the side of his penis , denies injury , denies urinary sx. Denies lesions

## 2019-03-04 LAB — URINE CULTURE: Culture: NO GROWTH

## 2019-03-04 LAB — RPR: RPR Ser Ql: NONREACTIVE

## 2019-03-04 LAB — GC/CHLAMYDIA PROBE AMP (~~LOC~~) NOT AT ARMC
Chlamydia: NEGATIVE
Neisseria Gonorrhea: NEGATIVE

## 2019-03-04 LAB — HIV ANTIBODY (ROUTINE TESTING W REFLEX): HIV Screen 4th Generation wRfx: NONREACTIVE

## 2019-07-26 ENCOUNTER — Emergency Department (HOSPITAL_COMMUNITY)
Admission: EM | Admit: 2019-07-26 | Discharge: 2019-07-27 | Disposition: A | Discharge status: Home / Self Care | Payer: No Typology Code available for payment source | Attending: Emergency Medicine | Admitting: Emergency Medicine

## 2019-07-26 ENCOUNTER — Other Ambulatory Visit: Payer: Self-pay

## 2019-07-26 ENCOUNTER — Encounter (HOSPITAL_COMMUNITY): Payer: Self-pay | Admitting: Emergency Medicine

## 2019-07-26 DIAGNOSIS — Z5321 Procedure and treatment not carried out due to patient leaving prior to being seen by health care provider: Secondary | ICD-10-CM | POA: Diagnosis not present

## 2019-07-26 DIAGNOSIS — M545 Low back pain: Secondary | ICD-10-CM | POA: Diagnosis present
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
## 2019-07-26 NOTE — ED Notes (Signed)
Advised patient to stay, patient left anyway.

## 2019-07-27 ENCOUNTER — Emergency Department: Payer: No Typology Code available for payment source

## 2019-07-27 ENCOUNTER — Emergency Department
Admission: EM | Admit: 2019-07-27 | Discharge: 2019-07-27 | Disposition: A | Discharge status: Home / Self Care | Payer: No Typology Code available for payment source | Attending: Emergency Medicine | Admitting: Emergency Medicine

## 2019-07-27 ENCOUNTER — Other Ambulatory Visit: Payer: Self-pay

## 2019-07-27 ENCOUNTER — Encounter: Payer: Self-pay | Admitting: Emergency Medicine

## 2019-07-27 DIAGNOSIS — S39012A Strain of muscle, fascia and tendon of lower back, initial encounter: Secondary | ICD-10-CM | POA: Diagnosis not present

## 2019-07-27 DIAGNOSIS — S3992XA Unspecified injury of lower back, initial encounter: Secondary | ICD-10-CM | POA: Diagnosis present

## 2019-07-27 DIAGNOSIS — Z87891 Personal history of nicotine dependence: Secondary | ICD-10-CM | POA: Insufficient documentation

## 2019-07-27 DIAGNOSIS — Y92410 Unspecified street and highway as the place of occurrence of the external cause: Secondary | ICD-10-CM | POA: Diagnosis not present

## 2019-07-27 DIAGNOSIS — Y93I9 Activity, other involving external motion: Secondary | ICD-10-CM | POA: Diagnosis not present

## 2019-07-27 DIAGNOSIS — Y999 Unspecified external cause status: Secondary | ICD-10-CM | POA: Diagnosis not present

## 2019-07-27 MED ORDER — CYCLOBENZAPRINE HCL 10 MG PO TABS: 10.0000 mg | ORAL_TABLET | Freq: Three times a day (TID) | ORAL | 0 refills | Status: DC | PRN

## 2019-07-27 MED ORDER — MELOXICAM 15 MG PO TABS: 15.0000 mg | ORAL_TABLET | Freq: Every day | ORAL | 2 refills | Status: DC
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
## 2019-07-27 NOTE — Discharge Instructions (Addendum)
Follow-up with your regular doctor Dr. Mack Guise if not better in 1 week.  Apply ice to lower back.  Take medications as prescribed.  Be active as this will keep your muscles from hurting.  Return emergency department worsening.
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
## 2019-07-27 NOTE — ED Notes (Signed)
Patient transported to X-ray 

## 2020-01-15 ENCOUNTER — Encounter (HOSPITAL_COMMUNITY): Payer: Self-pay | Admitting: Emergency Medicine

## 2020-01-15 ENCOUNTER — Emergency Department (HOSPITAL_COMMUNITY)
Admission: EM | Admit: 2020-01-15 | Discharge: 2020-01-15 | Disposition: A | Discharge status: Home / Self Care | Payer: Self-pay | Attending: Emergency Medicine | Admitting: Emergency Medicine

## 2020-01-15 DIAGNOSIS — A64 Unspecified sexually transmitted disease: Secondary | ICD-10-CM | POA: Insufficient documentation

## 2020-01-15 DIAGNOSIS — Z79899 Other long term (current) drug therapy: Secondary | ICD-10-CM | POA: Insufficient documentation

## 2020-01-15 DIAGNOSIS — Z87891 Personal history of nicotine dependence: Secondary | ICD-10-CM | POA: Insufficient documentation

## 2020-01-15 DIAGNOSIS — R369 Urethral discharge, unspecified: Secondary | ICD-10-CM | POA: Insufficient documentation

## 2020-01-15 DIAGNOSIS — F1210 Cannabis abuse, uncomplicated: Secondary | ICD-10-CM | POA: Insufficient documentation

## 2020-01-15 LAB — URINALYSIS, ROUTINE W REFLEX MICROSCOPIC
Bacteria, UA: NONE SEEN
Bilirubin Urine: NEGATIVE
Glucose, UA: NEGATIVE mg/dL
Hgb urine dipstick: NEGATIVE
Ketones, ur: 5 mg/dL — AB
Nitrite: NEGATIVE
Protein, ur: 100 mg/dL — AB
Specific Gravity, Urine: 1.034 — ABNORMAL HIGH (ref 1.005–1.030)
WBC, UA: >50 WBC/hpf — ABNORMAL HIGH (ref 0–5)
pH: 5 (ref 5.0–8.0)

## 2020-01-15 LAB — RPR: RPR Ser Ql: NONREACTIVE

## 2020-01-15 LAB — HIV ANTIBODY (ROUTINE TESTING W REFLEX): HIV Screen 4th Generation wRfx: NONREACTIVE

## 2020-01-15 MED ORDER — CEFTRIAXONE SODIUM 500 MG IJ SOLR
500.0000 mg | Freq: Once | INTRAMUSCULAR | Status: AC
  Administered 2020-01-15: 500 mg via INTRAMUSCULAR
  Filled 2020-01-15: qty 500

## 2020-01-15 MED ORDER — STERILE WATER FOR INJECTION IJ SOLN
INTRAMUSCULAR | Status: AC
  Filled 2020-01-15: qty 10

## 2020-01-15 MED ORDER — DOXYCYCLINE HYCLATE 100 MG PO CAPS: 100.0000 mg | ORAL_CAPSULE | Freq: Two times a day (BID) | ORAL | 0 refills | Status: DC

## 2020-01-15 NOTE — Discharge Instructions (Addendum)
Return for any new or worsening symptoms.

## 2020-01-15 NOTE — ED Triage Notes (Signed)
Pt reports a yellow discharge from his penis that began today, denies any urinary symptoms or pain. States he is concerned for stds

## 2020-01-15 NOTE — ED Notes (Signed)
Pt in restroom to collect urine sample.

## 2020-01-15 NOTE — ED Provider Notes (Signed)
[facility] EMERGENCY DEPARTMENT Provider Note   CSN: [id number] Arrival date & time: 01/15/20  0720     History Chief Complaint  Patient presents with  . Penile Discharge    Frederick Schmidt is a 24 y.o. male.  Who presents emergency department with a chief complaint of penile discharge.  The patient states that he had something similar to this last year was diagnosed with chlamydia.  When asked if the patient has any new sexual partners he states "Uh, it's usually just random people."  He denies any testicle pain, rashes, fevers, chills, dysuria.  HPI     History reviewed. No pertinent past medical history.  There are no problems to display for this patient.   History reviewed. No pertinent surgical history.     No family history on file.  Social History   Tobacco Use  . Smoking status: Former Research scientist (life sciences)  . Smokeless tobacco: Never Used  Substance Use Topics  . Alcohol use: Yes  . Drug use: Yes    Types: Marijuana    Home Medications Prior to Admission medications   Medication Sig Start Date End Date Taking? Authorizing Provider  cyclobenzaprine (FLEXERIL) 10 MG tablet Take 1 tablet (10 mg total) by mouth 3 (three) times daily as needed. 07/27/19   Fisher, Linden Dolin, PA-C  meloxicam (MOBIC) 15 MG tablet Take 1 tablet (15 mg total) by mouth daily. 07/27/19 07/26/20  Versie Starks, PA-C    Allergies    Shellfish allergy  Review of Systems   Review of Systems  Constitutional: Negative for chills and fever.  Genitourinary: Positive for discharge. Negative for dysuria, scrotal swelling and testicular pain.   Physical Exam Updated Vital Signs BP (!) 179/123 (BP Location: Left Wrist)   Pulse 82   Temp 98.8 F (37.1 C) (Oral)   Resp 16   Ht 5\' 8"  (1.727 m)   Wt 127 kg   SpO2 99%   BMI 42.57 kg/m   Physical Exam Vitals and nursing note reviewed. Exam conducted with a chaperone present.  Constitutional:      General: He is not in acute  distress.    Appearance: He is well-developed. He is not diaphoretic.  HENT:     Head: Normocephalic and atraumatic.  Eyes:     General: No scleral icterus.    Conjunctiva/sclera: Conjunctivae normal.  Cardiovascular:     Rate and Rhythm: Normal rate and regular rhythm.     Heart sounds: Normal heart sounds.  Pulmonary:     Effort: Pulmonary effort is normal. No respiratory distress.     Breath sounds: Normal breath sounds.  Abdominal:     Palpations: Abdomen is soft.     Tenderness: There is no abdominal tenderness.  Genitourinary:    Penis: Circumcised. Discharge present.      Testes:        Right: Mass, tenderness or swelling not present.        Left: Mass, tenderness or swelling not present.  Musculoskeletal:     Cervical back: Normal range of motion and neck supple.  Skin:    General: Skin is warm and dry.  Neurological:     Mental Status: He is alert.  Psychiatric:        Behavior: Behavior normal.     ED Results / Procedures / Treatments   Labs (all labs ordered are listed, but only abnormal results are displayed) Labs Reviewed - No data to display  EKG None  Radiology No  results found.  Procedures Procedures (including critical care time)  Medications Ordered in ED Medications - No data to display  ED Course  I have reviewed the triage vital signs and the nursing notes.  Pertinent labs & imaging results that were available during my care of the patient were reviewed by me and considered in my medical decision making (see chart for details).    MDM Rules/Calculators/A&P                       Patient here with Penile discharge, multiple sexual partners. Given IM rocephin 500mg  and d/c with doxycycline 100mg  BID x 7days. I reviewed the urine which appears contaminated and likely due to STI. Discussed safe sex and return precautions Final Clinical Impression(s) / ED Diagnoses Final diagnoses:  Penile discharge  STD (male)     Rx / DC Orders ED  Discharge Orders    None       , PA-C 01/15/20 Arthor Captain    Teg[place], 01/17/20, MD 01/15/20 [phone]

## 2020-01-16 LAB — GC/CHLAMYDIA PROBE AMP (~~LOC~~) NOT AT ARMC
Chlamydia: NEGATIVE
Comment: NEGATIVE
Comment: NORMAL
Neisseria Gonorrhea: POSITIVE — AB

## 2020-01-21 ENCOUNTER — Other Ambulatory Visit: Payer: Self-pay

## 2020-01-21 ENCOUNTER — Encounter (HOSPITAL_COMMUNITY): Payer: Self-pay | Admitting: Emergency Medicine

## 2020-01-21 ENCOUNTER — Emergency Department (HOSPITAL_COMMUNITY)
Admission: EM | Admit: 2020-01-21 | Discharge: 2020-01-21 | Disposition: A | Discharge status: Home / Self Care | Payer: HRSA Program | Attending: Emergency Medicine | Admitting: Emergency Medicine

## 2020-01-21 DIAGNOSIS — U071 COVID-19: Secondary | ICD-10-CM | POA: Insufficient documentation

## 2020-01-21 DIAGNOSIS — Z20822 Contact with and (suspected) exposure to covid-19: Secondary | ICD-10-CM

## 2020-01-21 DIAGNOSIS — R0981 Nasal congestion: Secondary | ICD-10-CM

## 2020-01-21 DIAGNOSIS — Z79899 Other long term (current) drug therapy: Secondary | ICD-10-CM | POA: Insufficient documentation

## 2020-01-21 DIAGNOSIS — J3489 Other specified disorders of nose and nasal sinuses: Secondary | ICD-10-CM | POA: Diagnosis present

## 2020-01-21 DIAGNOSIS — Z87891 Personal history of nicotine dependence: Secondary | ICD-10-CM | POA: Insufficient documentation

## 2020-01-21 LAB — SARS CORONAVIRUS 2 (TAT 6-24 HRS): SARS Coronavirus 2: POSITIVE — AB

## 2020-01-21 NOTE — ED Triage Notes (Signed)
Pt reports his mother tested covid + yesterday and was told to get tested. Reports some nasal congestion but thought it was related to allergies.

## 2020-01-21 NOTE — ED Provider Notes (Signed)
[facility] EMERGENCY DEPARTMENT Provider Note   CSN: [id number] Arrival date & time: 01/21/20  1145     History Chief Complaint  Patient presents with  . Covid Exposure    Frederick Schmidt is a 24 y.o. male with past medical history who presents for evaluation of Covid exposure.  Patient's mother tested positive for Covid yesterday.  Patient has had clear rhinorrhea and mild nonproductive cough.  He also has seasonal allergies and this feels similar.  He denies fever, chills, nausea, vomiting, chest pain, shortness of breath, hemoptysis, abdominal pain, diarrhea, dysuria, lateral leg swelling, redness or warmth, Denies additional aggravating or alleviating factors.  He is not take anything for symptoms.  He denies any headache, facial pressure, purulent rhinorrhea.    History obtained from patient and past medical records.  No interpreter used.  HPI     History reviewed. No pertinent past medical history.  There are no problems to display for this patient.   History reviewed. No pertinent surgical history.     No family history on file.  Social History   Tobacco Use  . Smoking status: Former Games developer  . Smokeless tobacco: Never Used  Substance Use Topics  . Alcohol use: Yes  . Drug use: Yes    Types: Marijuana    Home Medications Prior to Admission medications   Medication Sig Start Date End Date Taking? Authorizing Provider  cyclobenzaprine (FLEXERIL) 10 MG tablet Take 1 tablet (10 mg total) by mouth 3 (three) times daily as needed. 07/27/19   Fisher, Roselyn Bering, PA-C  doxycycline (VIBRAMYCIN) 100 MG capsule Take 1 capsule (100 mg total) by mouth 2 (two) times daily. One po bid x 7 days 01/15/20   Arthor Captain, PA-C  meloxicam (MOBIC) 15 MG tablet Take 1 tablet (15 mg total) by mouth daily. 07/27/19 07/26/20  Faythe Ghee, PA-C    Allergies    Shellfish allergy  Review of Systems   Review of Systems  Constitutional: Negative.   HENT: Positive  for congestion and rhinorrhea. Negative for dental problem, drooling, ear discharge, ear pain, facial swelling, hearing loss, mouth sores, nosebleeds, postnasal drip, sinus pressure, sinus pain, sneezing, sore throat, trouble swallowing and voice change.   Respiratory: Positive for cough. Negative for apnea, choking, chest tightness, shortness of breath, wheezing and stridor.   Cardiovascular: Negative.   Gastrointestinal: Negative.   Genitourinary: Negative.   Musculoskeletal: Negative.   Skin: Negative.   Neurological: Negative.   All other systems reviewed and are negative.   Physical Exam Updated Vital Signs BP (!) 129/102 (BP Location: Right Arm)   Pulse 60   Temp 98.5 F (36.9 C) (Oral)   Resp 18   Ht 5\' 10"  (1.778 m)   Wt 127 kg   SpO2 100%   BMI 40.18 kg/m   Physical Exam Vitals and nursing note reviewed.  Constitutional:      General: He is not in acute distress.    Appearance: He is not ill-appearing, toxic-appearing or diaphoretic.  HENT:     Head: Normocephalic and atraumatic.     Jaw: There is normal jaw occlusion.     Right Ear: Tympanic membrane, ear canal and external ear normal. There is no impacted cerumen. No hemotympanum. Tympanic membrane is not injected, scarred, perforated, erythematous, retracted or bulging.     Left Ear: Tympanic membrane, ear canal and external ear normal. There is no impacted cerumen. No hemotympanum. Tympanic membrane is not injected, scarred, perforated, erythematous, retracted or  bulging.     Ears:     Comments: No Mastoid tenderness.    Nose:     Comments: Clear rhinorrhea and congestion to bilateral nares.  No sinus tenderness.    Mouth/Throat:     Comments: Posterior oropharynx clear.  Mucous membranes moist.  Tonsils without erythema or exudate.  Uvula midline without deviation.  No evidence of PTA or RPA.  No drooling, dysphasia or trismus.  Phonation normal. Neck:     Trachea: Trachea and phonation normal.     Meningeal:  Brudzinski's sign and Kernig's sign absent.     Comments: No Neck stiffness or neck rigidity.  No meningismus.  No cervical lymphadenopathy. Cardiovascular:     Comments: No murmurs rubs or gallops. Pulmonary:     Comments: Clear to auscultation bilaterally without wheeze, rhonchi or rales.  No accessory muscle usage.  Able speak in full sentences. Abdominal:     Comments: Soft, nontender without rebound or guarding.  No CVA tenderness.  Musculoskeletal:     Comments: Moves all 4 extremities without difficulty.  Lower extremities without edema, erythema or warmth.  Skin:    Comments: Brisk capillary refill.  No rashes or lesions.  Neurological:     Mental Status: He is alert.     Comments: Ambulatory in department without difficulty.  Cranial nerves II through XII grossly intact.  No facial droop.  No aphasia.     ED Results / Procedures / Treatments   Labs (all labs ordered are listed, but only abnormal results are displayed) Labs Reviewed  SARS CORONAVIRUS 2 (TAT 6-24 HRS)    EKG None  Radiology No results found.  Procedures Procedures (including critical care time)  Medications Ordered in ED Medications - No data to display  ED Course  I have reviewed the triage vital signs and the nursing notes.  Pertinent labs & imaging results that were available during my care of the patient were reviewed by me and considered in my medical decision making (see chart for details).    24 year old male presents for evaluation of Covid exposure and rhinorrhea.  He is afebrile, nonseptic, non-ill-appearing.  Has clear rhinorrhea to bilateral nares.  Heart and lungs clear.  Abdomen soft.  Tolerating p.o. intake.  Mother recently diagnosed with Covid yesterday.  Was advised to obtain testing for himself by his mother's PCP.  States he also seasonal allergies and feels like his rhinorrhea is more likely due to his seasonal allergies.  He appears overall well.  Will obtain outpatient Covid  testing.  Low suspicion for acute bacterial etiology, trauma or occlusion as cause of his rhinorrhea.  Discussed symptomatic management at home.  The patient has been appropriately medically screened and/or stabilized in the ED. I have low suspicion for any other emergent medical condition which would require further screening, evaluation or treatment in the ED or require inpatient management.  Patient is hemodynamically stable and in no acute distress.  Patient able to ambulate in department prior to ED.  Evaluation does not show acute pathology that would require ongoing or additional emergent interventions while in the emergency department or further inpatient treatment.  I have discussed the diagnosis with the patient and answered all questions.  Pain is been managed while in the emergency department and patient has no further complaints prior to discharge.  Patient is comfortable with plan discussed in room and is stable for discharge at this time.  I have discussed strict return precautions for returning to the emergency department.  Patient was encouraged to follow-up with PCP/specialist refer to at discharge.      MDM Rules/Calculators/A&P                      Ramadan Couey was evaluated in Emergency Department on 01/21/2020 for the symptoms described in the history of present illness. He was evaluated in the context of the global COVID-19 pandemic, which necessitated consideration that the patient might be at risk for infection with the SARS-CoV-2 virus that causes COVID-19. Institutional protocols and algorithms that pertain to the evaluation of patients at risk for COVID-19 are in a state of rapid change based on information released by regulatory bodies including the CDC and federal and state organizations. These policies and algorithms were followed during the patient's care in the ED. Final Clinical Impression(s) / ED Diagnoses Final diagnoses:  Nasal congestion  Close exposure to  COVID-19 virus    Rx / DC Orders ED Discharge Orders    None       Manila Rommel A, PA-C 01/21/20 1503    Maudie Flakes, MD 01/22/20 [phone]

## 2020-01-22 ENCOUNTER — Encounter: Payer: Self-pay | Admitting: Physician Assistant

## 2020-01-22 ENCOUNTER — Telehealth: Payer: Self-pay | Admitting: Physician Assistant

## 2020-01-22 NOTE — Telephone Encounter (Signed)
Called to discuss with Frederick Schmidt about Covid symptoms and the use of bamlanivimab/etesevimab or casirivimab/imdevimab, a monoclonal antibody infusion for those with mild to moderate Covid symptoms and at a high risk of hospitalization.     Pt is qualified for this infusion at the [facility] infusion center due to co-morbid conditions and/or a member of an at-risk group, however declines infusion at this time. Symptoms tier reviewed as well as criteria for ending isolation.  Symptoms reviewed that would warrant ED/Hospital evaluation. Preventative practices reviewed. Patient verbalized understanding. Patient advised to call back if he decides that he does want to get infusion. Callback number to the infusion center given. Patient advised to go to Urgent care or ED with severe symptoms. Last date pt would be eligible for infusion is 5/3.     Patient Active Problem List   Diagnosis Date Noted  . Morbid obesity (HCC)     Cline Crock PA-C

## 2020-05-05 ENCOUNTER — Other Ambulatory Visit: Payer: Self-pay

## 2020-05-05 ENCOUNTER — Ambulatory Visit (HOSPITAL_COMMUNITY)
Admission: EM | Admit: 2020-05-05 | Discharge: 2020-05-05 | Disposition: A | Discharge status: Home / Self Care | Payer: Self-pay | Attending: Physician Assistant | Admitting: Physician Assistant

## 2020-05-05 ENCOUNTER — Encounter (HOSPITAL_COMMUNITY): Payer: Self-pay

## 2020-05-05 DIAGNOSIS — G8929 Other chronic pain: Secondary | ICD-10-CM

## 2020-05-05 DIAGNOSIS — M545 Low back pain, unspecified: Secondary | ICD-10-CM

## 2020-05-05 MED ORDER — TIZANIDINE HCL 4 MG PO TABS: 4.0000 mg | ORAL_TABLET | Freq: Every day | ORAL | 0 refills | Status: AC

## 2020-05-05 MED ORDER — NAPROXEN 500 MG PO TABS: 500.0000 mg | ORAL_TABLET | Freq: Two times a day (BID) | ORAL | 0 refills | Status: DC
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
## 2020-05-05 NOTE — Discharge Instructions (Signed)
Take the medicines as prescribed  Call Dr. Cyndia Diver office tomorrow morning to be seen tomorrow.

## 2020-05-05 NOTE — ED Provider Notes (Signed)
[facility]    CSN: [id number] Arrival date & time: 05/05/20  1648      History   Chief Complaint Chief Complaint  Patient presents with  . Back Pain    HPI Frederick Schmidt is a 24 y.o. male.   Patient reports for low back pain.  Reports is been going on for at least a year.  He reports is been worse over the last 2 weeks.  Reports it was bad enough that it prompted him to go to the emergency department a few days ago.  He was seen at [facility] facility.  X-ray obtained without acute findings.  Placed on NSAID and muscle relaxer.  He reports here today as this not helping much.  He reports he is also requiring forms to be filled out to return to work as his job requires this due to [place] facility placing him on work restrictions.  Reports pain is worse with bending and twisting motions  He denies radiation into his legs.  Denies weakness, tingling or notes in the lower extremities.  Controlling his bowel bladder per usual.  No new injuries.  Pain is consistent with how it was prior to emergency department visit and no changes.     Past Medical History:  Diagnosis Date  . Morbid obesity [facility])     Patient Active Problem List   Diagnosis Date Noted  . Morbid obesity (HCC)     History reviewed. No pertinent surgical history.     Home Medications    Prior to Admission medications   Medication Sig Start Date End Date Taking? Authorizing Provider  cyclobenzaprine (FLEXERIL) 10 MG tablet Take 1 tablet (10 mg total) by mouth 3 (three) times daily as needed. 07/27/19   Fisher, Roselyn Bering, PA-C  doxycycline (VIBRAMYCIN) 100 MG capsule Take 1 capsule (100 mg total) by mouth 2 (two) times daily. One po bid x 7 days 01/15/20   Arthor Captain, PA-C  meloxicam (MOBIC) 15 MG tablet Take 1 tablet (15 mg total) by mouth daily. 07/27/19 07/26/20  Fisher, Roselyn Bering, PA-C  naproxen (NAPROSYN) 500 MG tablet Take 1 tablet (500 mg total) by mouth 2 (two) times daily. 05/05/20   Emie Sommerfeld, Veryl Speak,  PA-C  tiZANidine (ZANAFLEX) 4 MG tablet Take 1 tablet (4 mg total) by mouth at bedtime for 10 days. 05/05/20 05/15/20  Stormee Duda, Veryl Speak, PA-C    Family History Family History  Problem Relation Age of Onset  . Hypertension Mother   . Healthy Father     Social History Social History   Tobacco Use  . Smoking status: Former Games developer  . Smokeless tobacco: Never Used  Substance Use Topics  . Alcohol use: Yes  . Drug use: Yes    Types: Marijuana     Allergies   Shellfish allergy   Review of Systems Review of Systems   Physical Exam Triage Vital Signs ED Triage Vitals  Enc Vitals Group     BP 05/05/20 1759 (!) 159/89     Pulse Rate 05/05/20 1759 60     Resp 05/05/20 1759 17     Temp 05/05/20 1759 98.1 F (36.7 C)     Temp src --      SpO2 05/05/20 1759 100 %     Weight --      Height --      Head Circumference --      Peak Flow --      Pain Score 05/05/20 1758 8  Pain Loc --      Pain Edu? --      Excl. in GC? --    No data found.  Updated Vital Signs BP (!) 159/89   Pulse 60   Temp 98.1 F (36.7 C)   Resp 17   SpO2 100%   Visual Acuity Right Eye Distance:   Left Eye Distance:   Bilateral Distance:    Right Eye Near:   Left Eye Near:    Bilateral Near:     Physical Exam Vitals and nursing note reviewed.  Cardiovascular:     Rate and Rhythm: Normal rate.  Pulmonary:     Effort: Pulmonary effort is normal. No respiratory distress.  Musculoskeletal:     Comments: Tenderness to palpation in the bilateral paralumbar spinal musculature.  No midline tenderness.  Straight leg raise equivocal.  No pain with Pearlean Brownie.  Some pain with forward flexion and extension.  Patient is ambulatory without issue.  Strength 5/5 in lower extremities.  Sensation grossly intact equal bilaterally.  Skin:    General: Skin is warm and dry.     Findings: No rash.  Neurological:     Mental Status: He is alert.      UC Treatments / Results  Labs (all labs ordered are  listed, but only abnormal results are displayed) Labs Reviewed - No data to display  EKG   Radiology No results found.  Procedures Procedures (including critical care time)  Medications Ordered in UC Medications - No data to display  Initial Impression / Assessment and Plan / UC Course  I have reviewed the triage vital signs and the nursing notes.  Pertinent labs & imaging results that were available during my care of the patient were reviewed by me and considered in my medical decision making (see chart for details).     #Chronic low back pain Patient is 24 year old presenting with acute on chronic low back pain.  No red flags.  X-ray findings at outside facility showed mild joint space narrowing L4-L5.  Will have patient follow-up with sports medicine.  We will switch to naproxen and Zanaflex.  Patient to call sports medicine group tomorrow morning for appointment.  Patient verbalized understanding plan of care. Final Clinical Impressions(s) / UC Diagnoses   Final diagnoses:  Chronic bilateral low back pain, unspecified whether sciatica present     Discharge Instructions     Take the medicines as prescribed  Call Dr. Cyndia Diver office tomorrow morning to be seen tomorrow.    ED Prescriptions    Medication Sig Dispense Auth. Provider   naproxen (NAPROSYN) 500 MG tablet Take 1 tablet (500 mg total) by mouth 2 (two) times daily. 30 tablet Suleman Gunning, Veryl Speak, PA-C   tiZANidine (ZANAFLEX) 4 MG tablet Take 1 tablet (4 mg total) by mouth at bedtime for 10 days. 10 tablet Clemie General, Veryl Speak, PA-C     I have reviewed the PDMP during this encounter.   Frederick Medicus, PA-C 05/05/20 1848

## 2020-05-06 ENCOUNTER — Ambulatory Visit (INDEPENDENT_AMBULATORY_CARE_PROVIDER_SITE_OTHER): Payer: Managed Care, Other (non HMO) | Admitting: Family Medicine

## 2020-05-06 ENCOUNTER — Encounter: Payer: Self-pay | Admitting: Family Medicine

## 2020-05-06 VITALS — BP 133/80 | HR 66 | Ht 71.0 in | Wt 296.0 lb

## 2020-05-06 DIAGNOSIS — G8929 Other chronic pain: Secondary | ICD-10-CM

## 2020-05-06 DIAGNOSIS — M545 Low back pain, unspecified: Secondary | ICD-10-CM | POA: Insufficient documentation

## 2020-05-06 NOTE — Patient Instructions (Signed)
Nice to meet you Please try the exercises  Please try heat  Physical therapy will give you a call   imagine will call to schedule the MRI  Please send me a message in MyChart with any questions or updates.  We will setup a virtual visit once the MRI is resulted.   --Dr. Jordan Likes
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
## 2020-05-08 ENCOUNTER — Telehealth: Payer: Self-pay | Admitting: Family Medicine

## 2020-05-08 NOTE — Telephone Encounter (Signed)
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  --Forwarding message.  -glh

## 2020-05-09 ENCOUNTER — Other Ambulatory Visit: Payer: Managed Care, Other (non HMO)

## 2020-05-11 ENCOUNTER — Telehealth: Payer: Self-pay | Admitting: Family Medicine

## 2020-05-11 NOTE — Telephone Encounter (Signed)
Completed paper work   Myra Rude, MD [facility] 05/11/2020, 5:31 PM

## 2020-05-11 NOTE — Telephone Encounter (Signed)
Called pt to advise provider update status on forms.  --FYI.  -glh

## 2020-05-12 ENCOUNTER — Telehealth: Payer: Self-pay | Admitting: Family Medicine

## 2020-05-12 NOTE — Telephone Encounter (Signed)
Patient called to check status of Work Accomodation form needed by employer---advised him they were faxed to the fax#  [phone] today.  --also advised his MRI was approved by [employer] after a peer to peer completed & he would be contacted for rescheduling.  --glh

## 2020-05-20 ENCOUNTER — Other Ambulatory Visit: Payer: Self-pay

## 2020-05-20 ENCOUNTER — Encounter: Payer: Self-pay | Admitting: Physical Therapy

## 2020-05-20 ENCOUNTER — Ambulatory Visit: Payer: Managed Care, Other (non HMO) | Attending: Family Medicine | Admitting: Physical Therapy

## 2020-05-20 DIAGNOSIS — M6281 Muscle weakness (generalized): Secondary | ICD-10-CM

## 2020-05-20 DIAGNOSIS — G8929 Other chronic pain: Secondary | ICD-10-CM | POA: Diagnosis present

## 2020-05-20 DIAGNOSIS — M545 Low back pain, unspecified: Secondary | ICD-10-CM
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
## 2020-05-20 NOTE — Patient Instructions (Signed)
Access Code: [facility] URL: [email or web address] Date: 05/20/2020 Prepared by: Rosana Hoes  Exercises Supine Lower Trunk Rotation - 1 x daily - 7 x weekly - 5 reps - 10 seconds hold Supine Bridge - 1 x daily - 7 x weekly - 3 sets - 5 reps - 5 seconds hold Bird Dog - 1 x daily - 7 x weekly - 3 sets - 10 reps - 5 seconds hold Seated Hamstring Stretch - 1 x daily - 7 x weekly - 2-3 reps - 15-20 seconds hold

## 2020-05-29 ENCOUNTER — Other Ambulatory Visit: Payer: Self-pay

## 2020-05-29 ENCOUNTER — Ambulatory Visit: Payer: Managed Care, Other (non HMO) | Attending: Family Medicine | Admitting: Physical Therapy

## 2020-05-29 ENCOUNTER — Encounter: Payer: Self-pay | Admitting: Physical Therapy

## 2020-05-29 DIAGNOSIS — M6281 Muscle weakness (generalized): Secondary | ICD-10-CM | POA: Diagnosis present

## 2020-05-29 DIAGNOSIS — G8929 Other chronic pain: Secondary | ICD-10-CM | POA: Insufficient documentation

## 2020-05-29 DIAGNOSIS — M545 Low back pain, unspecified: Secondary | ICD-10-CM

## 2020-05-29 NOTE — Therapy (Signed)
[facility] [address] [place], [place], [address] Phone: [phone]   Fax:  [phone]  Physical Therapy Treatment  Patient Details  Name: Frederick Schmidt MRN: [id number] Date of Birth: [birth year] Referring Provider (PT): Myra Rude, MD   Encounter Date: 05/29/2020   PT End of Session - 05/29/20 1045    Visit Number 2    Number of Visits 8    Date for PT Re-Evaluation 07/15/20    Authorization Type [employer]    PT Start Time 1045    PT Stop Time 1125    PT Time Calculation (min) 40 min    Activity Tolerance Patient tolerated treatment well    Behavior During Therapy [facility] for tasks assessed/performed           Past Medical History:  Diagnosis Date  . Morbid obesity (HCC)     History reviewed. No pertinent surgical history.  There were no vitals filed for this visit.   Subjective Assessment - 05/29/20 1044    Subjective Patient reports he reports less pain lately. States exercise are going well.    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 Yes    Pain Score 3     Pain Location Back    Pain Orientation Lower    Pain Descriptors / Indicators Aching    Pain Type Chronic pain    Pain Onset More than a month ago    Pain Frequency Constant                             [facility] Adult PT Treatment/Exercise - 05/29/20 0001      Exercises   Exercises Lumbar      Lumbar Exercises: Standing   Lifting Limitations 45# deadlift 3x6    Other Standing Lumbar Exercises Pallof press with freemotion 10# 10 x 3 sec hold      Lumbar Exercises: Supine   Dead Bug 5 reps   3 sets   Dead Bug Limitations knees remain bent, using physioball    Bridge 5 reps;5 seconds   2 sets   Bridge Limitations focus on core/gluteal engagement      Lumbar Exercises: Quadruped   Opposite Arm/Leg Raise 10  reps;5 seconds   2 sets   Opposite Arm/Leg Raise Limitations focus on maintain core engagement to avoid lumbar lordosis    Other Quadruped Lumbar Exercises Hydrant x 10                  PT Education - 05/29/20 1044    Education Details HEP    Person(s) Educated Patient    Methods Explanation    Comprehension Verbalized understanding;Need further instruction            PT Short Term Goals - 05/20/20 1153      PT SHORT TERM GOAL #1   Title Patient will be I with initial HEP to progress with PT    Time 4    Period Weeks    Status New    Target Date 06/17/20      PT SHORT TERM GOAL #2   Title Patient will be able to tolerate proper seated posture without flexed positioning    Time 4    Period Weeks    Status New    Target  Date 06/17/20      PT SHORT TERM GOAL #3   Title Patient will report improved pain to </= 3/10 at rest to reduce functional limitation    Time 4    Period Weeks    Status New    Target Date 06/17/20      PT SHORT TERM GOAL #4   Title Patient will understand FOTO score and available progress toward goal    Time 4    Period Weeks    Status New    Target Date 06/17/20             PT Long Term Goals - 05/20/20 1154      PT LONG TERM GOAL #1   Title Patient will be I with final HEP to maintain progress from PT    Time 8    Period Weeks    Status New    Target Date 07/15/20      PT LONG TERM GOAL #2   Title Patient will exhibit improve core and hip strength grossly >/= 4+/5 MMT in order to improve lifting tolerance at work    Time 8    Period Weeks    Status New    Target Date 07/15/20      PT LONG TERM GOAL #3   Title Patient will report return to full duty at work with </= 3/10 pain level to reduce limitation    Time 8    Period Weeks    Status New    Target Date 07/15/20      PT LONG TERM GOAL #4   Title Patient will report improved functional level to </= 34% limitation on FOTO    Time 8    Period Weeks    Status New      Target Date 07/15/20      PT LONG TERM GOAL #5   Title Patient will exhibit lumbar AROM WFL and </= 3/10 pain level to improve performing usual housework or dressing activities    Time 8    Period Weeks    Status New    Target Date 07/15/20                 Plan - 05/29/20 1045    Clinical Impression Statement Patient tolerated therapy well with no adverse effects. Continued progression of core/hip strengthening with good tolerance, initiated lifting and patrient did need instruction for hip hing etechnique. Patient does require cueing for proper core control and maintaining neutral lumbar spine, and he did report increased left sided low back discomfort with exercises once he became fatigued. 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    PT Treatment/Interventions ADLs/Self Care Home Management;Cryotherapy;Electrical Stimulation;Iontophoresis 4mg /ml Dexamethasone;Moist Heat;Traction;Neuromuscular re-education;Therapeutic exercise;Therapeutic activities;Functional mobility training;Patient/family education;Manual techniques;Balance training;Stair training;Gait training;Dry needling;Passive range of motion;Joint Manipulations;Spinal Manipulations    PT Next Visit Plan Assess HEP and progress PRN, continue lumbopelvic control and core/hip strengthening    PT Home Exercise Plan [email or web address]: LTR, bridge, bird dog, seated hamstring stretch    Consulted and Agree with Plan of Care Patient           Patient will benefit from skilled therapeutic intervention in order to improve the following deficits and impairments:  Decreased range of motion, Decreased strength, Pain, Postural dysfunction, Impaired flexibility, Decreased activity tolerance  Visit Diagnosis: Chronic bilateral low back pain without sciatica  Muscle weakness (generalized)  Problem List Patient Active Problem  List   Diagnosis Date Noted  . Chronic bilateral low back pain without sciatica 05/06/2020  . Morbid obesity (HCC)     Rosana Hoes, PT, DPT, LAT, ATC 05/29/20  11:29 AM Phone: [phone] Fax: [phone]   [facility] [address] [place], [place], [address] Phone: [phone]   Fax:  [phone]  Name: Tlaloc Taddei MRN: [id number] Date of Birth: [birth year]

## 2020-06-03 ENCOUNTER — Ambulatory Visit: Payer: Managed Care, Other (non HMO) | Admitting: Family Medicine

## 2020-06-05 ENCOUNTER — Ambulatory Visit: Payer: Managed Care, Other (non HMO)

## 2020-06-05 ENCOUNTER — Other Ambulatory Visit: Payer: Self-pay

## 2020-06-05 DIAGNOSIS — M545 Low back pain, unspecified: Secondary | ICD-10-CM

## 2020-06-05 DIAGNOSIS — M6281 Muscle weakness (generalized): Secondary | ICD-10-CM

## 2020-06-05 NOTE — Therapy (Signed)
[facility] [address] [place], [place], [address] Phone: [phone]   Fax:  [phone]  Physical Therapy Treatment  Patient Details  Name: Frederick Schmidt MRN: [id number] Date of Birth: 1996-01-02 Referring Provider (PT): Myra Rude, MD   Encounter Date: 06/05/2020   PT End of Session - 06/05/20 1524    Visit Number 8    Number of Visits 8    Date for PT Re-Evaluation 07/15/20    Authorization Type [employer]    PT Start Time 1151   12 mins late   PT Stop Time 1221    PT Time Calculation (min) 30 min    Activity Tolerance Patient tolerated treatment well    Behavior During Therapy [facility] for tasks assessed/performed           Past Medical History:  Diagnosis Date  . Morbid obesity (HCC)     History reviewed. No pertinent surgical history.  There were no vitals filed for this visit.   Subjective Assessment - 06/05/20 1204    Subjective Pt reports his low back pain level has overall been better.    Currently in Pain? Yes    Pain Score 3     Pain Location Back    Pain Orientation Lower    Pain Descriptors / Indicators Aching    Pain Type Chronic pain    Pain Onset More than a month ago    Pain Frequency Constant                             [facility] Adult PT Treatment/Exercise - 06/05/20 0001      Exercises   Exercises Lumbar      Lumbar Exercises: Aerobic   Nustep 5 mins; L7; arms and legs      Lumbar Exercises: Standing   Other Standing Lumbar Exercises Pallof press with BTB 10 x 3 sec hold      Lumbar Exercises: Supine   Dead Bug 5 reps   3 sets   Dead Bug Limitations knees remain bent, using physioball    Bridge 5 reps;5 seconds   2 sets   Bridge Limitations focus on core/gluteal engagement      Lumbar Exercises: Quadruped   Opposite Arm/Leg Raise 10 reps;5 seconds   2 sets   Opposite Arm/Leg Raise Limitations focus on maintain core engagement to avoid lumbar lordosis    Other  Quadruped Lumbar Exercises Hydrant x 10                  PT Education - 06/05/20 1523    Education Details HEP    Person(s) Educated Patient    Methods Explanation;Demonstration;Tactile cues;Verbal cues;Handout    Comprehension Verbalized understanding;Returned demonstration;Verbal cues required;Tactile cues required            PT Short Term Goals - 05/20/20 1153      PT SHORT TERM GOAL #1   Title Patient will be I with initial HEP to progress with PT    Time 4    Period Weeks    Status New    Target Date 06/17/20      PT SHORT TERM GOAL #2   Title Patient will be able to tolerate proper seated posture without flexed positioning    Time 4    Period Weeks    Status New    Target Date 06/17/20      PT SHORT TERM GOAL #3  Title Patient will report improved pain to </= 3/10 at rest to reduce functional limitation    Time 4    Period Weeks    Status New    Target Date 06/17/20      PT SHORT TERM GOAL #4   Title Patient will understand FOTO score and available progress toward goal    Time 4    Period Weeks    Status New    Target Date 06/17/20             PT Long Term Goals - 05/20/20 1154      PT LONG TERM GOAL #1   Title Patient will be I with final HEP to maintain progress from PT    Time 8    Period Weeks    Status New    Target Date 07/15/20      PT LONG TERM GOAL #2   Title Patient will exhibit improve core and hip strength grossly >/= 4+/5 MMT in order to improve lifting tolerance at work    Time 8    Period Weeks    Status New    Target Date 07/15/20      PT LONG TERM GOAL #3   Title Patient will report return to full duty at work with </= 3/10 pain level to reduce limitation    Time 8    Period Weeks    Status New    Target Date 07/15/20      PT LONG TERM GOAL #4   Title Patient will report improved functional level to </= 34% limitation on FOTO    Time 8    Period Weeks    Status New    Target Date 07/15/20      PT LONG TERM  GOAL #5   Title Patient will exhibit lumbar AROM WFL and </= 3/10 pain level to improve performing usual housework or dressing activities    Time 8    Period Weeks    Status New    Target Date 07/15/20                 Plan - 06/05/20 1839    Clinical Impression Statement PT focused on back/core/hip strengthening. Pt demonstrates improved trunk stability with these strengthening/stability exs which correlates with the pt's report of decreased back pain. Pt will benefit from continued skilled PT to further challenge the strength of these ares to reduce pain and maximize function.    Personal Factors and Comorbidities Time since onset of injury/illness/exacerbation;Past/Current Experience;Comorbidity 1    Comorbidities BMI    Examination-Activity Limitations Bend;Sit;Stand;Lift;Locomotion Level;Carry    Examination-Participation Restrictions Occupation;Community Activity    Stability/Clinical Decision Making Stable/Uncomplicated    Clinical Decision Making Low    Rehab Potential Good    PT Frequency 1x / week    PT Duration 8 weeks    PT Treatment/Interventions ADLs/Self Care Home Management;Cryotherapy;Electrical Stimulation;Iontophoresis 4mg /ml Dexamethasone;Moist Heat;Traction;Neuromuscular re-education;Therapeutic exercise;Therapeutic activities;Functional mobility training;Patient/family education;Manual techniques;Balance training;Stair training;Gait training;Dry needling;Passive range of motion;Joint Manipulations;Spinal Manipulations    PT Next Visit Plan Assess HEP and progress PRN, continue lumbopelvic control and core/hip strengthening    PT Home Exercise Plan [email or web address]: LTR, bridge, bird dog, seated hamstring stretch, dead bug c physioball, hydrant    Consulted and Agree with Plan of Care Patient           Patient will benefit from skilled therapeutic intervention in order to improve the following deficits and impairments:  Decreased range of motion, Decreased strength,  Pain, Postural dysfunction, Impaired flexibility, Decreased activity tolerance  Visit Diagnosis: Chronic bilateral low back pain without sciatica  Muscle weakness (generalized)     Problem List Patient Active Problem List   Diagnosis Date Noted  . Chronic bilateral low back pain without sciatica 05/06/2020  . Morbid obesity [facility])     Joellyn Rued MS, PT 06/05/20 6:51 PM  [facility] Outpatient Rehabilitation [facility] [address] [place], [place], [address] Phone: [phone]   Fax:  [phone]  Name: Nishaan Stanke MRN: [id number] Date of Birth: 1996-02-09

## 2020-06-09 ENCOUNTER — Other Ambulatory Visit: Payer: Self-pay | Admitting: Family Medicine

## 2020-06-10 ENCOUNTER — Ambulatory Visit: Payer: Managed Care, Other (non HMO) | Admitting: Physical Therapy

## 2020-06-10 ENCOUNTER — Other Ambulatory Visit: Payer: Self-pay

## 2020-06-10 ENCOUNTER — Encounter: Payer: Self-pay | Admitting: Physical Therapy

## 2020-06-10 DIAGNOSIS — M545 Low back pain, unspecified: Secondary | ICD-10-CM

## 2020-06-10 DIAGNOSIS — M6281 Muscle weakness (generalized): Secondary | ICD-10-CM

## 2020-06-10 NOTE — Patient Instructions (Signed)
Access Code: [facility] URL: [email or web address] Date: 06/10/2020 Prepared by: Rosana Hoes  Exercises Seated Hamstring Stretch - 1 x daily - 7 x weekly - 2-3 reps - 15-20 seconds hold Supine Lower Trunk Rotation - 1 x daily - 7 x weekly - 5 reps - 10 seconds hold Marching Bridge - 1 x daily - 7 x weekly - 3 sets - 5 reps Dead Bug with Swiss Ball - 1 x daily - 7 x weekly - 3 sets - 10 reps Bird Dog - 1 x daily - 7 x weekly - 3 sets - 10 reps - 5 seconds hold Quadruped Fire Hydrant - 1 x daily - 7 x weekly - 3 sets - 10 reps - 5 sec hold Standing Diagonal Chop - 1 x daily - 7 x weekly - 2 sets - 10 reps

## 2020-06-10 NOTE — Therapy (Signed)
[facility] Outpatient Rehabilitation [facility] [address] [place], [place], [address] Phone: [phone]   Fax:  [phone]  Physical Therapy Treatment  Patient Details  Name: Frederick Schmidt MRN: [id number] Date of Birth: 07/31/1996 Referring Provider (PT): Myra Rude, MD   Encounter Date: 06/10/2020   PT End of Session - 06/10/20 1136    Visit Number 4    Number of Visits 8    Date for PT Re-Evaluation 07/15/20    Authorization Type [employer]    PT Start Time 1130    PT Stop Time 1212    PT Time Calculation (min) 42 min    Activity Tolerance Patient tolerated treatment well    Behavior During Therapy [facility] for tasks assessed/performed           Past Medical History:  Diagnosis Date  . Morbid obesity (HCC)     History reviewed. No pertinent surgical history.  There were no vitals filed for this visit.   Subjective Assessment - 06/10/20 1134    Subjective Patient reports he woke up hurting yesterday, could have slept the wrong way. He is feeling a little bit better today. He reports he has been feeling better overall.    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 No/denies                             [facility] Adult PT Treatment/Exercise - 06/10/20 0001      Exercises   Exercises Lumbar      Lumbar Exercises: Aerobic   Recumbent Bike L3 x 5 min      Lumbar Exercises: Standing   Lifting Limitations 45# deadlift 3x8    Other Standing Lumbar Exercises Banded chop 2 x 10 with green band      Lumbar Exercises: Supine   Dead Bug 5 reps   2 sets   Dead Bug Limitations physioball    Bridge 5 reps;5 seconds    Bridge with March 5 reps    Straight Leg Raise 10 reps      Lumbar Exercises: Quadruped   Opposite Arm/Leg Raise 5 reps;5 seconds   2 sets                 PT Education - 06/10/20 1136     Education Details HEP    Person(s) Educated Patient    Methods Explanation;Demonstration;Handout;Verbal cues    Comprehension Verbalized understanding;Need further instruction;Returned demonstration;Verbal cues required            PT Short Term Goals - 05/20/20 1153      PT SHORT TERM GOAL #1   Title Patient will be I with initial HEP to progress with PT    Time 4    Period Weeks    Status New    Target Date 06/17/20      PT SHORT TERM GOAL #2   Title Patient will be able to tolerate proper seated posture without flexed positioning    Time 4    Period Weeks    Status New    Target Date 06/17/20      PT SHORT TERM GOAL #3   Title Patient will report improved pain to </= 3/10 at rest to reduce functional limitation    Time 4    Period Weeks  Status New    Target Date 06/17/20      PT SHORT TERM GOAL #4   Title Patient will understand FOTO score and available progress toward goal    Time 4    Period Weeks    Status New    Target Date 06/17/20             PT Long Term Goals - 05/20/20 1154      PT LONG TERM GOAL #1   Title Patient will be I with final HEP to maintain progress from PT    Time 8    Period Weeks    Status New    Target Date 07/15/20      PT LONG TERM GOAL #2   Title Patient will exhibit improve core and hip strength grossly >/= 4+/5 MMT in order to improve lifting tolerance at work    Time 8    Period Weeks    Status New    Target Date 07/15/20      PT LONG TERM GOAL #3   Title Patient will report return to full duty at work with </= 3/10 pain level to reduce limitation    Time 8    Period Weeks    Status New    Target Date 07/15/20      PT LONG TERM GOAL #4   Title Patient will report improved functional level to </= 34% limitation on FOTO    Time 8    Period Weeks    Status New    Target Date 07/15/20      PT LONG TERM GOAL #5   Title Patient will exhibit lumbar AROM WFL and </= 3/10 pain level to improve performing usual  housework or dressing activities    Time 8    Period Weeks    Status New    Target Date 07/15/20                 Plan - 06/10/20 1147    Clinical Impression Statement Patient tolerated therapy well with no adverse effects. He is progressing well with his core strengthening with good tolerance, he does not left sided low back discomfort with bridges. HEP updated this visit. Patient is scheduled to return to full duty next week so will assess to see how he responds to greater activity. 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    PT Treatment/Interventions ADLs/Self Care Home Management;Cryotherapy;Electrical Stimulation;Iontophoresis 4mg /ml Dexamethasone;Moist Heat;Traction;Neuromuscular re-education;Therapeutic exercise;Therapeutic activities;Functional mobility training;Patient/family education;Manual techniques;Balance training;Stair training;Gait training;Dry needling;Passive range of motion;Joint Manipulations;Spinal Manipulations    PT Next Visit Plan Assess HEP and progress PRN, continue lumbopelvic control and core/hip strengthening    PT Home Exercise Plan [email or web address]: LTR, marching bridge, SLR, dead bug with physioball, bird dog, quaduped hydrant, seated hamstring stretch    Consulted and Agree with Plan of Care Patient           Patient will benefit from skilled therapeutic intervention in order to improve the following deficits and impairments:  Decreased range of motion, Decreased strength, Pain, Postural dysfunction, Impaired flexibility, Decreased activity tolerance  Visit Diagnosis: Chronic bilateral low back pain without sciatica  Muscle weakness (generalized)     Problem List Patient Active Problem List   Diagnosis Date Noted  . Chronic bilateral low back pain without sciatica 05/06/2020  . Morbid obesity (HCC)     07/06/2020, PT,  DPT, LAT,  ATC 06/10/20  12:17 PM Phone: [phone] Fax: [phone]   [facility] Outpatient Rehabilitation [facility] [address] [place], [place], [address] Phone: [phone]   Fax:  [phone]  Name: Frederick Schmidt MRN: [id number] Date of Birth: Oct 12, 1995

## 2020-06-13 ENCOUNTER — Other Ambulatory Visit: Payer: Self-pay

## 2020-06-13 ENCOUNTER — Ambulatory Visit
Admission: RE | Admit: 2020-06-13 | Discharge: 2020-06-13 | Disposition: A | Discharge status: Home / Self Care | Payer: Managed Care, Other (non HMO) | Source: Ambulatory Visit | Attending: Family Medicine | Admitting: Family Medicine

## 2020-06-13 DIAGNOSIS — M545 Low back pain, unspecified: Secondary | ICD-10-CM

## 2020-06-13 DIAGNOSIS — G8929 Other chronic pain: Secondary | ICD-10-CM

## 2020-06-17 ENCOUNTER — Telehealth: Payer: Self-pay | Admitting: Physical Therapy

## 2020-06-17 ENCOUNTER — Ambulatory Visit: Payer: Managed Care, Other (non HMO) | Admitting: Physical Therapy

## 2020-06-17 NOTE — Telephone Encounter (Signed)
Attempted to contact patient in regard to missed PT appointment. Patient did not answer and unable to leave VM.   Rosana Hoes, PT, DPT, LAT, ATC 06/17/20  3:58 PM Phone: [phone] Fax: [phone]

## 2020-06-18 ENCOUNTER — Ambulatory Visit (INDEPENDENT_AMBULATORY_CARE_PROVIDER_SITE_OTHER): Payer: Managed Care, Other (non HMO) | Admitting: Family Medicine

## 2020-06-18 ENCOUNTER — Other Ambulatory Visit: Payer: Self-pay

## 2020-06-18 DIAGNOSIS — M545 Low back pain, unspecified: Secondary | ICD-10-CM

## 2020-06-18 DIAGNOSIS — G8929 Other chronic pain: Secondary | ICD-10-CM | POA: Diagnosis not present

## 2020-06-18 NOTE — Patient Instructions (Signed)
Good to see you Please continue physical therapy  Please let me know 2 days before the MRI and I can send in medicine  Please send me a message in MyChart with any questions or updates.  We will schedule a virtual visit once the MRI is resulted.   --Dr. Jordan Likes

## 2020-06-18 NOTE — Progress Notes (Signed)
  Frederick Schmidt - 24 y.o. male MRN [id number]  Date of birth: 1996/03/30  SUBJECTIVE:  Including CC & ROS.  No chief complaint on file.   Frederick Schmidt is a 24 y.o. male that is going up for his back pain.  He was unable to get the MRI completed due to same anxiety with the procedure.  Still having pain.  Feels that physical therapy is helping his pain.   Review of Systems See HPI   HISTORY: Past Medical, Surgical, Social, and Family History Reviewed & Updated per EMR.   Pertinent Historical Findings include:  Past Medical History:  Diagnosis Date  . Morbid obesity (HCC)     No past surgical history on file.  Family History  Problem Relation Age of Onset  . Hypertension Mother   . Healthy Father     Social History   Socioeconomic History  . Marital status: Single    Spouse name: Not on file  . Number of children: Not on file  . Years of education: Not on file  . Highest education level: Not on file  Occupational History  . Not on file  Tobacco Use  . Smoking status: Former Games developer  . Smokeless tobacco: Never Used  Substance and Sexual Activity  . Alcohol use: Yes  . Drug use: Yes    Types: Marijuana  . Sexual activity: Yes  Other Topics Concern  . Not on file  Social History Narrative  . Not on file   Social Determinants of Health   Financial Resource Strain:   . Difficulty of Paying Living Expenses: Not on file  Food Insecurity:   . Worried About Programme researcher, broadcasting/film/video in the Last Year: Not on file  . Ran Out of Food in the Last Year: Not on file  Transportation Needs:   . Lack of Transportation (Medical): Not on file  . Lack of Transportation (Non-Medical): Not on file  Physical Activity:   . Days of Exercise per Week: Not on file  . Minutes of Exercise per Session: Not on file  Stress:   . Feeling of Stress : Not on file  Social Connections:   . Frequency of Communication with Friends and Family: Not on file  . Frequency of Social Gatherings with  Friends and Family: Not on file  . Attends Religious Services: Not on file  . Active Member of Clubs or Organizations: Not on file  . Attends Banker Meetings: Not on file  . Marital Status: Not on file  Intimate Partner Violence:   . Fear of Current or Ex-Partner: Not on file  . Emotionally Abused: Not on file  . Physically Abused: Not on file  . Sexually Abused: Not on file     PHYSICAL EXAM:  VS: There were no vitals taken for this visit. Physical Exam Gen: NAD, alert, cooperative with exam, well-appearing     ASSESSMENT & PLAN:   Chronic bilateral low back pain without sciatica Pain is ongoing.  Has gotten improvement with physical therapy. -Counseled on home exercise therapy and supportive care. -Continue physical therapy. -Reorder the MRI and provide Valium for the procedure. -Provided work note.

## 2020-06-18 NOTE — Assessment & Plan Note (Signed)
Pain is ongoing.  Has gotten improvement with physical therapy. -Counseled on home exercise therapy and supportive care. -Continue physical therapy. -Reorder the MRI and provide Valium for the procedure. -Provided work note.

## 2020-06-18 NOTE — Addendum Note (Signed)
Addended by: Kathi Simpers F on: 06/18/2020 01:09 PM   Modules accepted: Orders

## 2020-06-26 ENCOUNTER — Ambulatory Visit: Payer: Managed Care, Other (non HMO) | Admitting: Physical Therapy

## 2020-07-02 ENCOUNTER — Telehealth: Payer: Self-pay | Admitting: Physical Therapy

## 2020-07-02 ENCOUNTER — Ambulatory Visit: Payer: Managed Care, Other (non HMO) | Attending: Family Medicine | Admitting: Physical Therapy

## 2020-07-02 DIAGNOSIS — M6281 Muscle weakness (generalized): Secondary | ICD-10-CM | POA: Insufficient documentation

## 2020-07-02 DIAGNOSIS — M545 Low back pain, unspecified: Secondary | ICD-10-CM | POA: Insufficient documentation

## 2020-07-02 DIAGNOSIS — G8929 Other chronic pain: Secondary | ICD-10-CM | POA: Insufficient documentation

## 2020-07-02 NOTE — Telephone Encounter (Signed)
Contacted patient due to missed PT appointment. He stated he thought the appointment was on a different date. He was reminded of his next scheduled appointment on 07/08/2020 and regarding the attendance policy where he would be discharged if he had one more no-show. Patient expressed understanding.   Rosana Hoes, PT, DPT, LAT, ATC 07/02/20  12:09 PM Phone: [phone] Fax: [phone]

## 2020-07-08 ENCOUNTER — Encounter: Payer: Self-pay | Admitting: Physical Therapy

## 2020-07-08 ENCOUNTER — Other Ambulatory Visit: Payer: Self-pay

## 2020-07-08 ENCOUNTER — Ambulatory Visit: Payer: Managed Care, Other (non HMO) | Admitting: Physical Therapy

## 2020-07-08 DIAGNOSIS — M545 Low back pain, unspecified: Secondary | ICD-10-CM

## 2020-07-08 DIAGNOSIS — M6281 Muscle weakness (generalized): Secondary | ICD-10-CM | POA: Diagnosis present

## 2020-07-08 DIAGNOSIS — G8929 Other chronic pain: Secondary | ICD-10-CM | POA: Diagnosis present

## 2020-07-08 NOTE — Therapy (Signed)
[facility] Outpatient Rehabilitation [facility] [address] [place], [place], [address] Phone: [phone]   Fax:  [phone]  Physical Therapy Treatment  Patient Details  Name: Frederick Schmidt MRN: [id number] Date of Birth: [birth year] Referring Provider (PT): Myra Rude, MD   Encounter Date: 07/08/2020   PT End of Session - 07/08/20 1223    Visit Number 5    Number of Visits 8    Date for PT Re-Evaluation 07/15/20    Authorization Type [employer]    PT Start Time 1216    PT Stop Time 1256    PT Time Calculation (min) 40 min    Activity Tolerance Patient tolerated treatment well    Behavior During Therapy [facility] for tasks assessed/performed           Past Medical History:  Diagnosis Date  . Morbid obesity (HCC)     History reviewed. No pertinent surgical history.  There were no vitals filed for this visit.   Subjective Assessment - 07/08/20 1221    Subjective Patient reports a little bit of pain last week, he missed his appointment due to scheduling issue. States his back is feeling ok today. He is back to work on light duty.    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 Yes    Pain Score 3     Pain Location Back    Pain Orientation Lower    Pain Descriptors / Indicators Aching    Pain Type Chronic pain    Pain Onset More than a month ago    Pain Frequency Intermittent              [facility] PT Assessment - 07/08/20 0001      Assessment   Medical Diagnosis Chronic bilateral low back pain without sciatica    Referring Provider (PT) Myra Rude, MD      Strength   Overall Strength Comments Core strength grossly 4/5 MMT with decreased lumbopelvic control    Right Hip Extension 4/5    Right Hip ABduction 4/5    Left Hip Extension 4/5    Left Hip ABduction 4/5                         [facility] Adult PT  Treatment/Exercise - 07/08/20 0001      Exercises   Exercises Lumbar      Lumbar Exercises: Aerobic   Recumbent Bike L3 x 5 min      Lumbar Exercises: Standing   Lifting Limitations Deadlift 75# 4x6    Other Standing Lumbar Exercises Pallof press 10# FM 2 x 10 each      Lumbar Exercises: Supine   Dead Bug 10 reps   2 sets   Dead Bug Limitations physioball    Bridge 10 reps;3 seconds    Bridge with March 5 reps   2 sets   Other Supine Lumbar Exercises 90-90 alternating heel taps x 10      Lumbar Exercises: Sidelying   Other Sidelying Lumbar Exercises Modified plank on knees with clamshell hold 2 x 3 x 20 seconds                  PT Education - 07/08/20 1223    Education Details HEP, FOTO    Person(s) Educated Patient    Methods Explanation  Comprehension Verbalized understanding;Need further instruction            PT Short Term Goals - 07/08/20 1246      PT SHORT TERM GOAL #1   Title Patient will be I with initial HEP to progress with PT    Time 4    Period Weeks    Status Achieved    Target Date 06/17/20      PT SHORT TERM GOAL #2   Title Patient will be able to tolerate proper seated posture without flexed positioning    Time 4    Period Weeks    Status Achieved    Target Date 06/17/20      PT SHORT TERM GOAL #3   Title Patient will report improved pain to </= 3/10 at rest to reduce functional limitation    Time 4    Period Weeks    Status Achieved    Target Date 06/17/20      PT SHORT TERM GOAL #4   Title Patient will understand FOTO score and available progress toward goal    Time 4    Period Weeks    Status Achieved    Target Date 06/17/20             PT Long Term Goals - 07/08/20 1246      PT LONG TERM GOAL #1   Title Patient will be I with final HEP to maintain progress from PT    Time 8    Period Weeks    Status On-going    Target Date 07/15/20      PT LONG TERM GOAL #2   Title Patient will exhibit improve core and hip  strength grossly >/= 4+/5 MMT in order to improve lifting tolerance at work    Time 8    Period Weeks    Status On-going    Target Date 07/15/20      PT LONG TERM GOAL #3   Title Patient will report return to full duty at work with </= 3/10 pain level to reduce limitation    Time 8    Period Weeks    Status On-going    Target Date 07/15/20      PT LONG TERM GOAL #4   Title Patient will report improved functional level to </= 34% limitation on FOTO    Time 8    Period Weeks    Status New      PT LONG TERM GOAL #5   Title Patient will exhibit lumbar AROM WFL and </= 3/10 pain level to improve performing usual housework or dressing activities    Time 8    Period Weeks    Status New    Target Date 07/15/20                 Plan - 07/08/20 1227    Clinical Impression Statement Patient tolerated therapy well with no adverse effects. This his first visit in approximately 1 month and he seems to be improving, he has returned to work at [employer] duty with no worsening of symtpoms. He continues to progress well with core/hip strengthening and was able to perform lifting with good mechanics without increase in pain level this visit. He does continues to exhibit gross strength deficit for tasks required of hime at work. 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    PT Treatment/Interventions  ADLs/Self Care Home Management;Cryotherapy;Electrical Stimulation;Iontophoresis 4mg /ml Dexamethasone;Moist Heat;Traction;Neuromuscular re-education;Therapeutic exercise;Therapeutic activities;Functional mobility training;Patient/family education;Manual techniques;Balance training;Stair training;Gait training;Dry needling;Passive range of motion;Joint Manipulations;Spinal Manipulations    PT Next Visit Plan Reassess and update POC, review HEP and progress PRN, continue lumbopelvic control and  core/hip strengthening    PT Home Exercise Plan WRLKEDCY: LTR, marching bridge, SLR, dead bug with physioball, bird dog, quaduped hydrant, seated hamstring stretch    Consulted and Agree with Plan of Care Patient           Patient will benefit from skilled therapeutic intervention in order to improve the following deficits and impairments:  Decreased range of motion, Decreased strength, Pain, Postural dysfunction, Impaired flexibility, Decreased activity tolerance  Visit Diagnosis: Chronic bilateral low back pain without sciatica  Muscle weakness (generalized)     Problem List Patient Active Problem List   Diagnosis Date Noted  . Chronic bilateral low back pain without sciatica 05/06/2020  . Morbid obesity (HCC)     07/06/2020, PT, DPT, LAT, ATC 07/08/20  12:57 PM Phone: [phone] Fax: [phone]   [facility] Outpatient Rehabilitation [facility] [address] [place], [place], [place] Phone: [phone]   Fax:  [phone]  Name: Frederick Schmidt MRN: Carey Bullocks Date of Birth: [birth year]

## 2020-07-14 ENCOUNTER — Ambulatory Visit: Payer: Managed Care, Other (non HMO) | Admitting: Physical Therapy

## 2020-07-16 ENCOUNTER — Telehealth: Payer: Self-pay | Admitting: Family Medicine

## 2020-07-16 MED ORDER — DIAZEPAM 5 MG PO TABS
ORAL_TABLET | ORAL | 0 refills | Status: DC
Start: 2020-07-16 — End: 2020-07-27

## 2020-07-16 NOTE — Telephone Encounter (Signed)
Provided valium for MRI.   Myra Rude, MD [facility] 07/16/2020, 1:10 PM

## 2020-07-16 NOTE — Telephone Encounter (Signed)
Patient called states tried to contact office yesterday but message says our phones not working--  --He is requesting provider call in a Rx , his MRI is scheduled for tomorrow 10/22 & he needs something to calm/relax him.  --Pt's  pharmacy  :   [id number] [place], [place] - [address] AT [facility] [address] Phone:  [phone]  Fax:  [phone]     --Forwarding request to provider.  -glh

## 2020-07-17 ENCOUNTER — Ambulatory Visit
Admission: RE | Admit: 2020-07-17 | Discharge: 2020-07-17 | Disposition: A | Discharge status: Home / Self Care | Payer: Managed Care, Other (non HMO) | Source: Ambulatory Visit | Attending: Family Medicine | Admitting: Family Medicine

## 2020-07-17 ENCOUNTER — Other Ambulatory Visit: Payer: Managed Care, Other (non HMO)

## 2020-07-17 ENCOUNTER — Other Ambulatory Visit: Payer: Self-pay

## 2020-07-17 DIAGNOSIS — G8929 Other chronic pain: Secondary | ICD-10-CM

## 2020-07-17 DIAGNOSIS — M545 Low back pain, unspecified: Secondary | ICD-10-CM

## 2020-07-21 ENCOUNTER — Ambulatory Visit: Payer: Managed Care, Other (non HMO) | Attending: Family Medicine | Admitting: Family Medicine

## 2020-07-21 ENCOUNTER — Other Ambulatory Visit: Payer: Self-pay

## 2020-07-21 DIAGNOSIS — M5136 Other intervertebral disc degeneration, lumbar region: Secondary | ICD-10-CM

## 2020-07-21 DIAGNOSIS — M5126 Other intervertebral disc displacement, lumbar region: Secondary | ICD-10-CM | POA: Diagnosis not present

## 2020-07-21 MED ORDER — NAPROXEN 500 MG PO TABS: 500.0000 mg | ORAL_TABLET | Freq: Two times a day (BID) | ORAL | 1 refills | Status: DC

## 2020-07-21 MED ORDER — CYCLOBENZAPRINE HCL 10 MG PO TABS: 10.0000 mg | ORAL_TABLET | Freq: Three times a day (TID) | ORAL | 1 refills | Status: DC | PRN

## 2020-07-21 NOTE — Progress Notes (Signed)
Has back pain.
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
## 2020-07-24 ENCOUNTER — Ambulatory Visit: Payer: Managed Care, Other (non HMO) | Admitting: Physical Therapy

## 2020-07-27 ENCOUNTER — Telehealth (INDEPENDENT_AMBULATORY_CARE_PROVIDER_SITE_OTHER): Payer: Managed Care, Other (non HMO) | Admitting: Family Medicine

## 2020-07-27 ENCOUNTER — Other Ambulatory Visit: Payer: Self-pay

## 2020-07-27 DIAGNOSIS — G8929 Other chronic pain: Secondary | ICD-10-CM

## 2020-07-27 DIAGNOSIS — M545 Low back pain, unspecified: Secondary | ICD-10-CM

## 2020-07-27 MED ORDER — DIAZEPAM 5 MG PO TABS: ORAL_TABLET | ORAL | 0 refills | Status: DC

## 2020-07-27 NOTE — Assessment & Plan Note (Signed)
MRI was revealing for bulging disc as well as foraminal stenosis.  Has gotten some improvement with physical therapy.  Symptoms are intermittent in nature. -Counseled on home exercise therapy and supportive care. -Can continue physical therapy. -Epidural. - valium for epidural.

## 2020-07-27 NOTE — Progress Notes (Signed)
Virtual Visit via Telephone Note  I connected with Frederick Schmidt on 07/27/20 at  1:10 PM EDT by telephone and verified that I am speaking with the correct person using two identifiers.  Location: Patient: home Provider: office    I discussed the limitations, risks, security and privacy concerns of performing an evaluation and management service by telephone and the availability of in person appointments. I also discussed with the patient that there may be a patient responsible charge related to this service. The patient expressed understanding and agreed to proceed.   History of Present Illness:  Frederick Schmidt is a 24 year old male that is following up after the MRI of his lumbar spine.  MRI was revealing for a central disc protrusion as well as foraminal narrowing.   Observations/Objective:   Assessment and Plan:  Low back pain with disc bulge: MRI was revealing for bulging disc as well as foraminal stenosis.  Has gotten some improvement with physical therapy.  Symptoms are intermittent in nature. -Counseled on home exercise therapy and supportive care. -Can continue physical therapy. -Epidural. - valium for epidural.  Follow Up Instructions:    I discussed the assessment and treatment plan with the patient. The patient was provided an opportunity to ask questions and all were answered. The patient agreed with the plan and demonstrated an understanding of the instructions.   The patient was advised to call back or seek an in-person evaluation if the symptoms worsen or if the condition fails to improve as anticipated.  I provided 6 minutes of non-face-to-face time during this encounter.   Frederick Gandy, MD

## 2020-07-28 ENCOUNTER — Encounter: Payer: Self-pay | Admitting: Family Medicine

## 2020-07-29 ENCOUNTER — Ambulatory Visit: Payer: Managed Care, Other (non HMO) | Attending: Family Medicine | Admitting: Physical Therapy

## 2020-07-29 ENCOUNTER — Encounter: Payer: Self-pay | Admitting: Physical Therapy

## 2020-07-29 ENCOUNTER — Other Ambulatory Visit: Payer: Self-pay

## 2020-07-29 DIAGNOSIS — G8929 Other chronic pain: Secondary | ICD-10-CM | POA: Diagnosis present

## 2020-07-29 DIAGNOSIS — M545 Low back pain, unspecified: Secondary | ICD-10-CM

## 2020-07-29 DIAGNOSIS — M6281 Muscle weakness (generalized): Secondary | ICD-10-CM

## 2020-07-29 NOTE — Patient Instructions (Signed)
Access Code: [facility] URL: [email or web address] Date: 07/29/2020 Prepared by: Rosana Hoes  Exercises Seated Hamstring Stretch - 1 x daily - 7 x weekly - 2-3 reps - 15-20 seconds hold Supine Lower Trunk Rotation - 1 x daily - 7 x weekly - 5 reps - 10 seconds hold Marching Bridge - 1 x daily - 7 x weekly - 3 sets - 5 reps Dead Bug with Swiss Ball - 1 x daily - 7 x weekly - 3 sets - 10 reps Bird Dog - 1 x daily - 7 x weekly - 3 sets - 10 reps - 5 seconds hold Quadruped Fire Hydrant - 1 x daily - 7 x weekly - 3 sets - 10 reps - 5 sec hold Standing Diagonal Chop - 1 x daily - 7 x weekly - 2 sets - 10 reps

## 2020-07-29 NOTE — Therapy (Signed)
[facility] Outpatient Rehabilitation [facility] [address] [place], [place], [address] Phone: [phone]   Fax:  [phone]  Physical Therapy Treatment / ERO  Patient Details  Name: Frederick Schmidt MRN: [id number] Date of Birth: 17-May-1996 Referring Provider (PT): Myra Rude, MD   Encounter Date: 07/29/2020   PT End of Session - 07/29/20 1408    Visit Number 6    Number of Visits 12    Date for PT Re-Evaluation 09/09/20    Authorization Type [employer]    PT Start Time 1403    PT Stop Time 1445    PT Time Calculation (min) 42 min    Activity Tolerance Patient tolerated treatment well    Behavior During Therapy [facility] for tasks assessed/performed           Past Medical History:  Diagnosis Date  . Morbid obesity (HCC)     History reviewed. No pertinent surgical history.  There were no vitals filed for this visit.   Subjective Assessment - 07/29/20 1404    Subjective Patient reports he has been painful the past few days. He reports he starts back at work full duty tomorrow and is scheduled to have an epidural on Friday.    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 Yes    Pain Score 6    patient reports "not too bad today"   Pain Location Back    Pain Orientation Lower    Pain Descriptors / Indicators Aching    Pain Type Chronic pain    Pain Onset More than a month ago    Pain Frequency Intermittent    Effect of Pain on Daily Activities Patient mainly limited at work              [facility] PT Assessment - 07/29/20 0001      Assessment   Medical Diagnosis Chronic bilateral low back pain without sciatica    Referring Provider (PT) Myra Rude, MD    Next MD Visit Not scheduled, epidural scheduled for 07/31/2020      Precautions   Precautions None      Restrictions   Weight Bearing Restrictions No      Balance Screen    Has the patient fallen in the past 6 months No      Prior Function   Level of Independence Independent    Vocation Full time employment    Vocation Requirements Patient works at [employer], states he is unale to move at the rate he needs to in order make incentives      Observation/Other Assessments   Focus on Therapeutic Outcomes (FOTO)  28% limitation   predicted 34%     AROM   Lumbar Flexion WFL - feels a stretch    Lumbar Extension WFL    Lumbar - Right Side Bend WFL    Lumbar - Left Side Bend WFL    Lumbar - Right Rotation WFL    Lumbar - Left Rotation WFL      PROM   Overall PROM Comments Hip PROM grossly WFL and non-painful      Strength   Overall Strength Comments Core strength grossly 4/5 MMT with decreased lumbopelvic control    Right Hip Extension 4+/5    Right Hip ABduction 4+/5    Left Hip Extension 4+/5    Left  Hip ABduction 4+/5      Flexibility   Hamstrings Limited bilaterally - pain felt posterior thigh bilaterally                         [facility] Adult PT Treatment/Exercise - 07/29/20 0001      Self-Care   Self-Care Other Self-Care Comments    Other Self-Care Comments  FOTO, POC      Exercises   Exercises Lumbar      Lumbar Exercises: Aerobic   Recumbent Bike L3 x 5 min      Lumbar Exercises: Standing   Lifting Limitations Deadlift 75# 4x6    Other Standing Lumbar Exercises Farmer's carry unilateral 45# 2 x 2 laps      Lumbar Exercises: Supine   Dead Bug 10 reps   3 sets   Bridge with March 5 reps   3 sets     Lumbar Exercises: Sidelying   Other Sidelying Lumbar Exercises Modified plank on knees with clamshell 3 x 10 each      Lumbar Exercises: Quadruped   Opposite Arm/Leg Raise 5 reps;5 seconds   3 sets                 PT Education - 07/29/20 1408    Education Details HEP, FOTO, POC    Person(s) Educated Patient    Methods Explanation;Demonstration;Verbal cues    Comprehension Verbalized  understanding;Need further instruction;Returned demonstration;Verbal cues required            PT Short Term Goals - 07/08/20 1246      PT SHORT TERM GOAL #1   Title Patient will be I with initial HEP to progress with PT    Time 4    Period Weeks    Status Achieved    Target Date 06/17/20      PT SHORT TERM GOAL #2   Title Patient will be able to tolerate proper seated posture without flexed positioning    Time 4    Period Weeks    Status Achieved    Target Date 06/17/20      PT SHORT TERM GOAL #3   Title Patient will report improved pain to </= 3/10 at rest to reduce functional limitation    Time 4    Period Weeks    Status Achieved    Target Date 06/17/20      PT SHORT TERM GOAL #4   Title Patient will understand FOTO score and available progress toward goal    Time 4    Period Weeks    Status Achieved    Target Date 06/17/20             PT Long Term Goals - 07/29/20 1436      PT LONG TERM GOAL #1   Title Patient will be I with final HEP to maintain progress from PT    Time 6    Period Weeks    Status On-going    Target Date 09/09/20      PT LONG TERM GOAL #2   Title Patient will exhibit improve core and hip strength grossly >/= 5/5 MMT in order to perform all full duty work related tasks without limitation    Time 6    Period Weeks    Status Revised    Target Date 09/09/20      PT LONG TERM GOAL #3   Title Patient will report return to full duty at work with </=  3/10 pain level to reduce limitation    Baseline Patient scheduled to return to full duty soon    Time 6    Period Weeks    Status On-going    Target Date 09/09/20      PT LONG TERM GOAL #4   Title Patient will report improved functional level to </= 34% limitation on FOTO    Baseline 28% limitation    Time 8    Period Weeks    Status Achieved      PT LONG TERM GOAL #5   Title Patient will exhibit lumbar AROM WFL and </= 3/10 pain level to improve performing usual housework or  dressing activities    Time 8    Period Weeks    Status Achieved                 Plan - 07/29/20 1424    Clinical Impression Statement Patient tolerated therapy well with no adverse effects. He is progressing well with core and hip strengthening exercises, and demonstrates improved strength compared to previous assessments. He does continue to report lower back pain and is scheduled to return to full duty at work which will require him perform heavier lifting activties. He would benefit from continued skilled PT to progress strength and control in order to reduce pain and return to work without limitation.    Personal Factors and Comorbidities Time since onset of injury/illness/exacerbation;Past/Current Experience;Comorbidity 1    Examination-Activity Limitations Bend;Sit;Stand;Lift;Locomotion Level;Carry    Examination-Participation Restrictions Occupation    Rehab Potential Good    PT Frequency 1x / week    PT Duration 6 weeks    PT Treatment/Interventions ADLs/Self Care Home Management;Cryotherapy;Electrical Stimulation;Iontophoresis 4mg /ml Dexamethasone;Moist Heat;Traction;Neuromuscular re-education;Therapeutic exercise;Therapeutic activities;Functional mobility training;Patient/family education;Manual techniques;Balance training;Stair training;Gait training;Dry needling;Passive range of motion;Joint Manipulations;Spinal Manipulations    PT Next Visit Plan Review HEP and progress PRN, continue lumbopelvic control and core/hip strengthening    PT Home Exercise Plan WRLKEDCY: LTR, marching bridge, SLR, dead bug with physioball, bird dog, quaduped hydrant, seated hamstring stretch    Consulted and Agree with Plan of Care Patient           Patient will benefit from skilled therapeutic intervention in order to improve the following deficits and impairments:  Decreased range of motion, Decreased strength, Pain, Postural dysfunction, Impaired flexibility, Decreased activity  tolerance  Visit Diagnosis: Chronic bilateral low back pain without sciatica  Muscle weakness (generalized)     Problem List Patient Active Problem List   Diagnosis Date Noted  . Chronic bilateral low back pain without sciatica 05/06/2020  . Morbid obesity (HCC)     07/06/2020, PT, DPT, LAT, ATC 07/29/20  2:53 PM Phone: [phone] Fax: [phone]   [facility] Outpatient Rehabilitation [facility] [address] [place], [place], [place] Phone: [phone]   Fax:  [phone]  Name: Frederick Schmidt MRN: Carey Bullocks Date of Birth: Aug 07, 1996

## 2020-07-30 ENCOUNTER — Telehealth: Payer: Self-pay | Admitting: Family Medicine

## 2020-07-30 NOTE — Telephone Encounter (Signed)
Victorino Dike from [employer] called states pt's Epidural denied by Ins Co(ask what is next step ? ) she will be faxing denial rcvd from Ins carrier to our office for review & possible appeal .  --glh

## 2020-07-31 ENCOUNTER — Other Ambulatory Visit: Payer: Managed Care, Other (non HMO)

## 2020-08-05 ENCOUNTER — Ambulatory Visit: Payer: Managed Care, Other (non HMO) | Admitting: Physical Therapy

## 2020-08-05 ENCOUNTER — Other Ambulatory Visit: Payer: Self-pay

## 2020-08-05 ENCOUNTER — Encounter: Payer: Self-pay | Admitting: Physical Therapy

## 2020-08-05 DIAGNOSIS — M545 Low back pain, unspecified: Secondary | ICD-10-CM

## 2020-08-05 DIAGNOSIS — M6281 Muscle weakness (generalized): Secondary | ICD-10-CM

## 2020-08-05 DIAGNOSIS — G8929 Other chronic pain: Secondary | ICD-10-CM

## 2020-08-05 NOTE — Therapy (Addendum)
[place], [place], [address] Phone: [phone]   Fax:  [phone]  Physical Therapy Treatment / Discharge  Patient Details  Name: Frederick Schmidt MRN: [id number] Date of Birth: December 02, 1995 Referring Provider (PT): Rosemarie Ax, MD   Encounter Date: 08/05/2020   PT End of Session - 08/05/20 1227    Visit Number 7    Number of Visits 12    Date for PT Re-Evaluation 09/09/20    Authorization Type [employer]    PT Start Time 1222    PT Stop Time 1300    PT Time Calculation (min) 38 min    Activity Tolerance Patient tolerated treatment well    Behavior During Therapy [facility] for tasks assessed/performed           Past Medical History:  Diagnosis Date  . Morbid obesity ([place])     History reviewed. No pertinent surgical history.  There were no vitals filed for this visit.   Subjective Assessment - 08/05/20 1225    Subjective Patient reports low back pain has been hurting more since he started back at work at full duty. He denies any pain currently. His injection was reschedule for tomorrow.    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 No/denies    Pain Score 0-No pain    Pain Location Back    Pain Orientation Lower              [facility] PT Assessment - 08/05/20 0001      Assessment   Medical Diagnosis Chronic bilateral low back pain without sciatica    Referring Provider (PT) Rosemarie Ax, MD      AROM   Lumbar Extension [facility] - prone press-up elicits localized pain at end range                         [facility] Adult PT Treatment/Exercise - 08/05/20 0001      Lumbar Exercises: Stretches   Press Ups 10 reps   2 sets (pre and post manual)     Lumbar Exercises: Aerobic   Recumbent Bike L3 x 5 min      Lumbar Exercises: Standing   Lifting Limitations Deadlift  75# 5x6    Other Standing Lumbar Exercises Lumbar extension using strap MWM x 10      Lumbar Exercises: Supine   Dead Bug 10 reps   2 sets   Dead Bug Limitations physioball    Bridge 10 reps;5 seconds   2 sets     Lumbar Exercises: Quadruped   Opposite Arm/Leg Raise 10 reps;5 seconds   2 sets     Manual Therapy   Manual Therapy Joint mobilization    Manual therapy comments Patient reported improvement in lower back pain with prone press-up following manual    Joint Mobilization CPA and left UPA mobs to lower lumbar region                  PT Education - 08/05/20 1227    Education Details HEP    Person(s) Educated Patient    Methods Explanation;Handout    Comprehension Verbalized understanding;Need further instruction            PT Short Term Goals - 07/08/20 1246      PT SHORT TERM GOAL #  1   Title Patient will be I with initial HEP to progress with PT    Time 4    Period Weeks    Status Achieved    Target Date 06/17/20      PT SHORT TERM GOAL #2   Title Patient will be able to tolerate proper seated posture without flexed positioning    Time 4    Period Weeks    Status Achieved    Target Date 06/17/20      PT SHORT TERM GOAL #3   Title Patient will report improved pain to </= 3/10 at rest to reduce functional limitation    Time 4    Period Weeks    Status Achieved    Target Date 06/17/20      PT SHORT TERM GOAL #4   Title Patient will understand FOTO score and available progress toward goal    Time 4    Period Weeks    Status Achieved    Target Date 06/17/20             PT Long Term Goals - 07/29/20 1436      PT LONG TERM GOAL #1   Title Patient will be I with final HEP to maintain progress from PT    Time 6    Period Weeks    Status On-going    Target Date 09/09/20      PT LONG TERM GOAL #2   Title Patient will exhibit improve core and hip strength grossly >/= 5/5 MMT in order to perform all full duty work related tasks without  limitation    Time 6    Period Weeks    Status Revised    Target Date 09/09/20      PT LONG TERM GOAL #3   Title Patient will report return to full duty at work with </= 3/10 pain level to reduce limitation    Baseline Patient scheduled to return to full duty soon    Time 6    Period Weeks    Status On-going    Target Date 09/09/20      PT LONG TERM GOAL #4   Title Patient will report improved functional level to </= 34% limitation on FOTO    Baseline 28% limitation    Time 8    Period Weeks    Status Achieved      PT LONG TERM GOAL #5   Title Patient will exhibit lumbar AROM WFL and </= 3/10 pain level to improve performing usual housework or dressing activities    Time 8    Period Weeks    Status Achieved                 Plan - 08/05/20 1227    Clinical Impression Statement Patient tolerated therapy well with no adverse effects. 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 This visit he noted increased lower back pain with end range extension but this improved with manual therapy directed at the lower back and he was provided an exercises to work on this at home. He did not report any radicular symptoms, his lower back pain seems more facet driven and he continues to exhibit deficit with strength and lumbopelvic control. He would benefit from continued skilled PT to progress strength and control in order to reduce pain and return to work without limitation.    PT Treatment/Interventions ADLs/Self Care Home Management;Cryotherapy;Dealer  Stimulation;Iontophoresis 58m/ml Dexamethasone;Moist Heat;Traction;Neuromuscular re-education;Therapeutic exercise;Therapeutic activities;Functional mobility training;Patient/family education;Manual techniques;Balance training;Stair training;Gait training;Dry needling;Passive range of motion;Joint Manipulations;Spinal Manipulations    PT Next Visit Plan Review HEP and  progress PRN, continue lumbopelvic control and core/hip strengthening    PT Home Exercise Plan [id number]: LTR, marching bridge, SLR, dead bug with physioball, bird dog, quaduped hydrant, seated hamstring stretch, standing banded chop    Consulted and Agree with Plan of Care Patient           Patient will benefit from skilled therapeutic intervention in order to improve the following deficits and impairments:  Decreased range of motion, Decreased strength, Pain, Postural dysfunction, Impaired flexibility, Decreased activity tolerance  Visit Diagnosis: Chronic bilateral low back pain without sciatica  Muscle weakness (generalized)     Problem List Patient Active Problem List   Diagnosis Date Noted  . Chronic bilateral low back pain without sciatica 05/06/2020  . Morbid [place]     CHilda Blades PT, DPT, LAT, ATC 08/05/20  1:09 PM Phone: [phone]: 3[facility] [address]Phone: [phone]  Fax:  [phone] Name: Frederick GarrawayMRN: [id number] of Birth: 702-06-1996  PHYSICAL THERAPY DISCHARGE SUMMARY  Visits from Start of Care: 7  Current functional level related to goals / functional outcomes: See above   Remaining deficits: See above   Education / Equipment: HEP Plan:                                                    Patient goals were not met. Patient is being discharged due to not returning since the last visit.  ?????    CHilda Blades PT, DPT, LAT, ATC 09/28/20  8:52 AM Phone: [phone]: [phone]

## 2020-08-06 ENCOUNTER — Ambulatory Visit
Admission: RE | Admit: 2020-08-06 | Discharge: 2020-08-06 | Disposition: A | Discharge status: Home / Self Care | Payer: Managed Care, Other (non HMO) | Source: Ambulatory Visit | Attending: Family Medicine | Admitting: Family Medicine

## 2020-08-06 DIAGNOSIS — G8929 Other chronic pain: Secondary | ICD-10-CM

## 2020-08-06 DIAGNOSIS — M545 Low back pain, unspecified: Secondary | ICD-10-CM

## 2020-08-06 MED ORDER — IOPAMIDOL (ISOVUE-M 200) INJECTION 41%
1.0000 mL | Freq: Once | INTRAMUSCULAR | Status: AC
  Administered 2020-08-06: 1 mL via EPIDURAL

## 2020-08-06 MED ORDER — METHYLPREDNISOLONE ACETATE 40 MG/ML INJ SUSP (RADIOLOG
120.0000 mg | Freq: Once | INTRAMUSCULAR | Status: AC
  Administered 2020-08-06: 120 mg via EPIDURAL

## 2020-08-06 NOTE — Discharge Instructions (Signed)

## 2020-08-12 ENCOUNTER — Ambulatory Visit: Payer: Managed Care, Other (non HMO) | Admitting: Physical Therapy

## 2020-08-19 ENCOUNTER — Ambulatory Visit: Payer: Managed Care, Other (non HMO) | Admitting: Physical Therapy

## 2020-08-26 ENCOUNTER — Ambulatory Visit: Payer: Managed Care, Other (non HMO) | Attending: Family Medicine | Admitting: Physical Therapy

## 2020-09-02 ENCOUNTER — Ambulatory Visit: Payer: Managed Care, Other (non HMO) | Admitting: Physical Therapy

## 2021-09-02 ENCOUNTER — Emergency Department
Admission: EM | Admit: 2021-09-02 | Discharge: 2021-09-02 | Disposition: A | Discharge status: Home / Self Care | Payer: Managed Care, Other (non HMO) | Attending: Emergency Medicine | Admitting: Emergency Medicine

## 2021-09-02 ENCOUNTER — Other Ambulatory Visit: Payer: Self-pay

## 2021-09-02 ENCOUNTER — Encounter: Payer: Self-pay | Admitting: Emergency Medicine

## 2021-09-02 DIAGNOSIS — R220 Localized swelling, mass and lump, head: Secondary | ICD-10-CM | POA: Diagnosis present

## 2021-09-02 DIAGNOSIS — K0520 Aggressive periodontitis, unspecified: Secondary | ICD-10-CM | POA: Diagnosis not present

## 2021-09-02 DIAGNOSIS — Z87891 Personal history of nicotine dependence: Secondary | ICD-10-CM | POA: Insufficient documentation

## 2021-09-02 MED ORDER — LIDOCAINE VISCOUS HCL 2 % MT SOLN
15.0000 mL | Freq: Once | OROMUCOSAL | Status: AC
  Administered 2021-09-02: 15 mL via OROMUCOSAL
  Filled 2021-09-02: qty 15

## 2021-09-02 MED ORDER — LIDOCAINE VISCOUS HCL 2 % MT SOLN: 15.0000 mL | OROMUCOSAL | 0 refills | Status: DC | PRN

## 2021-09-02 MED ORDER — AMOXICILLIN 500 MG PO CAPS: 500.0000 mg | ORAL_CAPSULE | Freq: Three times a day (TID) | ORAL | 0 refills | Status: DC

## 2021-09-02 NOTE — ED Triage Notes (Signed)
Pt comes into the ED via POV c/o swelling of his right lower wisdom tooth that is causing facial swelling and pain in the right ear.  Pt in NAD at this time.

## 2021-09-02 NOTE — Discharge Instructions (Addendum)
You have some inflamed gum that is overlying your wisdom tooth (pericoronitis).  This condition is usually corrected by dental provider.  Use warm salty mouth rinses after every meal, to help reduce gum swelling.  Use the topical lidocaine jelly to help reduce pain.  Follow-up with your dental provider for ongoing treatment.

## 2021-09-02 NOTE — ED Provider Notes (Signed)
[facility] Emergency Department Provider Note ____________________________________________  Time seen: 63  I have reviewed the triage vital signs and the nursing notes.  HISTORY  Chief Complaint  Facial Swelling   HPI Frederick Schmidt is a 25 y.o. male presents to the ED with complaints of right lower pain and swelling to the wisdom tooth.  He also reports some swelling to the right side of the face and referred pain to the right ear.  He notes is difficult to open the jaw completely secondary to pain at the right lower molar.  He reports similar symptoms on the left lower molar a few years ago.  He denies any fever, chills, sweats.  Also denies any spontaneous purulent drainage from the tooth or gumline.  He denies any difficulty breathing, swallowing, or controlling oral secretions.  Past Medical History:  Diagnosis Date   Morbid obesity [facility])     Patient Active Problem List   Diagnosis Date Noted   Chronic bilateral low back pain without sciatica 05/06/2020   Morbid obesity (HCC)     History reviewed. No pertinent surgical history.  Prior to Admission medications   Medication Sig Start Date End Date Taking? Authorizing Provider  amoxicillin (AMOXIL) 500 MG capsule Take 1 capsule (500 mg total) by mouth 3 (three) times daily. 09/02/21  Yes Kevina Piloto, Charlesetta Ivory, PA-C  lidocaine (XYLOCAINE) 2 % solution Use as directed 15 mLs in the mouth or throat every 4 (four) hours as needed for mouth pain. 09/02/21  Yes Catheleen Langhorne, Charlesetta Ivory, PA-C  cyclobenzaprine (FLEXERIL) 10 MG tablet Take 1 tablet (10 mg total) by mouth 3 (three) times daily as needed. 07/21/20   Hoy Register, MD  diazepam (VALIUM) 5 MG tablet Take 30 minutes prior to procedure. May repeat x 1 if needed. 07/27/20   Myra Rude, MD  naproxen (NAPROSYN) 500 MG tablet Take 1 tablet (500 mg total) by mouth 2 (two) times daily. 07/21/20   Hoy Register, MD    Allergies Shellfish allergy  and Blueberry flavor  Family History  Problem Relation Age of Onset   Hypertension Mother    Healthy Father     Social History Social History   Tobacco Use   Smoking status: Former   Smokeless tobacco: Never  Substance Use Topics   Alcohol use: Yes   Drug use: Yes    Types: Marijuana    Review of Systems  Constitutional: Negative for fever. Eyes: Negative for visual changes. ENT: Negative for sore throat.  Right lower dental pain as above. Cardiovascular: Negative for chest pain. Respiratory: Negative for shortness of breath. Gastrointestinal: Negative for abdominal pain, vomiting and diarrhea. Genitourinary: Negative for dysuria. Musculoskeletal: Negative for back pain. Skin: Negative for rash. Neurological: Negative for headaches, focal weakness or numbness. ____________________________________________  PHYSICAL EXAM:  VITAL SIGNS: ED Triage Vitals  Enc Vitals Group     BP 09/02/21 1109 (!) 146/95     Pulse Rate 09/02/21 1109 62     Resp 09/02/21 1109 16     Temp 09/02/21 1109 98.6 F (37 C)     Temp Source 09/02/21 1109 Oral     SpO2 09/02/21 1109 99 %     Weight 09/02/21 1108 296 lb 4.8 oz (134.4 kg)     Height 09/02/21 1108 6' (1.829 m)     Head Circumference --      Peak Flow --      Pain Score 09/02/21 1108 8     Pain  Loc --      Pain Edu? --      Excl. in GC? --     Constitutional: Alert and oriented. Well appearing and in no distress. Head: Normocephalic and atraumatic. Eyes: Conjunctivae are normal. Normal extraocular movements Mouth/Throat: Mucous membranes are moist.  Uvula is midline and tonsils are flat.  No oropharyngeal lesions are appreciated.  Patient with pericoronitis noted to the right lower molar.  There is some mild surrounding erythema.  Patient with decreased extension of the lower jaw secondary to local swelling.  No brawny sublingual edema is appreciated. Neck: Supple. No thyromegaly. Hematological/Lymphatic/Immunological: No  cervical lymphadenopathy. Cardiovascular: Normal rate, regular rhythm. Normal distal pulses. Respiratory: Normal respiratory effort.  Musculoskeletal: Nontender with normal range of motion in all extremities.  Neurologic:  Normal gait without ataxia. Normal speech and language. No gross focal neurologic deficits are appreciated. Skin:  Skin is warm, dry and intact. No rash noted. Psychiatric: Mood and affect are normal. Patient exhibits appropriate insight and judgment. ____________________________________________    {LABS (pertinent positives/negatives)  ____________________________________________  {EKG  ____________________________________________   RADIOLOGY Official radiology report(s): No results found. ____________________________________________  PROCEDURES  Viscous lido 2% oral  Procedures ____________________________________________   INITIAL IMPRESSION / ASSESSMENT AND PLAN / ED COURSE  As part of my medical decision making, I reviewed the following data within the electronic MEDICAL RECORD NUMBER Notes from prior ED visits  DDX: pericoronitis, dental abscess, dental caries, tonsillitis  Patient with ED evaluation of several days of right lower jaw pain and discomfort localized to the right third molar.  Patient is found to have a pericoronitis acutely.  No focal abscesses appreciated.  Some trismus is noted secondary to inflammation.  Patient discharged with a prescription for amoxicillin as well as lidocaine solution.  He is encouraged to take medicines as prescribed, and use warm salt water gargles after every meal.  He should follow-up with a local dental provider for definitive management.  [name] was evaluated in Emergency Department on 09/02/2021 for the symptoms described in the history of present illness. He was evaluated in the context of the global COVID-19 pandemic, which necessitated consideration that the patient might be at risk for infection with the  SARS-CoV-2 virus that causes COVID-19. Institutional protocols and algorithms that pertain to the evaluation of patients at risk for COVID-19 are in a state of rapid change based on information released by regulatory bodies including the CDC and federal and state organizations. These policies and algorithms were followed during the patient's care in the ED. ____________________________________________  FINAL CLINICAL IMPRESSION(S) / ED DIAGNOSES  Final diagnoses:  Acute pericoronitis      Karmen Stabs, Charlesetta Ivory, PA-C 09/02/21 1853    Sharman Cheek, MD 09/04/21 [phone]

## 2021-09-12 ENCOUNTER — Emergency Department (HOSPITAL_COMMUNITY)
Admission: EM | Admit: 2021-09-12 | Discharge: 2021-09-12 | Disposition: A | Discharge status: Home / Self Care | Payer: Managed Care, Other (non HMO) | Attending: Physician Assistant | Admitting: Physician Assistant

## 2021-09-12 ENCOUNTER — Other Ambulatory Visit: Payer: Self-pay

## 2021-09-12 ENCOUNTER — Encounter (HOSPITAL_COMMUNITY): Payer: Self-pay | Admitting: Emergency Medicine

## 2021-09-12 DIAGNOSIS — Z87891 Personal history of nicotine dependence: Secondary | ICD-10-CM | POA: Diagnosis not present

## 2021-09-12 DIAGNOSIS — S61250A Open bite of right index finger without damage to nail, initial encounter: Secondary | ICD-10-CM | POA: Insufficient documentation

## 2021-09-12 DIAGNOSIS — W540XXA Bitten by dog, initial encounter: Secondary | ICD-10-CM | POA: Diagnosis not present

## 2021-09-12 MED ORDER — AMOXICILLIN-POT CLAVULANATE 875-125 MG PO TABS: 1.0000 | ORAL_TABLET | Freq: Two times a day (BID) | ORAL | 0 refills | Status: AC

## 2021-09-12 NOTE — ED Provider Notes (Signed)
[facility] EMERGENCY DEPARTMENT Provider Note   CSN: [id number] Arrival date & time: 09/12/21  1457     History Chief Complaint  Patient presents with   Animal Bite    Frederick Schmidt is a 25 y.o. male.  Pt was bitten by his friends dog   The history is provided by the patient. No language interpreter was used.  Animal Bite Contact animal:  Unable to specify Location:  Finger Finger injury location:  R index finger Pain details:    Quality:  Aching   Timing:  Constant   Progression:  Worsening Tetanus status:  Up to date Relieved by:  Nothing Worsened by:  Nothing Associated symptoms: swelling       Past Medical History:  Diagnosis Date   Morbid obesity [facility])     Patient Active Problem List   Diagnosis Date Noted   Chronic bilateral low back pain without sciatica 05/06/2020   Morbid obesity (HCC)     History reviewed. No pertinent surgical history.     Family History  Problem Relation Age of Onset   Hypertension Mother    Healthy Father     Social History   Tobacco Use   Smoking status: Former   Smokeless tobacco: Never  Substance Use Topics   Alcohol use: Yes   Drug use: Yes    Types: Marijuana    Home Medications Prior to Admission medications   Medication Sig Start Date End Date Taking? Authorizing Provider  amoxicillin-clavulanate (AUGMENTIN) 875-125 MG tablet Take 1 tablet by mouth 2 (two) times daily for 10 days. 09/12/21 09/22/21 Yes Cheron Schaumann K, PA-C  amoxicillin (AMOXIL) 500 MG capsule Take 1 capsule (500 mg total) by mouth 3 (three) times daily. 09/02/21   Menshew, Charlesetta Ivory, PA-C  cyclobenzaprine (FLEXERIL) 10 MG tablet Take 1 tablet (10 mg total) by mouth 3 (three) times daily as needed. 07/21/20   Hoy Register, MD  diazepam (VALIUM) 5 MG tablet Take 30 minutes prior to procedure. May repeat x 1 if needed. 07/27/20   Myra Rude, MD  lidocaine (XYLOCAINE) 2 % solution Use as directed 15 mLs in the  mouth or throat every 4 (four) hours as needed for mouth pain. 09/02/21   Menshew, Charlesetta Ivory, PA-C  naproxen (NAPROSYN) 500 MG tablet Take 1 tablet (500 mg total) by mouth 2 (two) times daily. 07/21/20   Hoy Register, MD    Allergies    Shellfish allergy and Blueberry flavor  Review of Systems   Review of Systems  All other systems reviewed and are negative.  Physical Exam Updated Vital Signs BP 135/85 (BP Location: Left Arm)    Pulse 70    Temp 98.2 F (36.8 C) (Oral)    Resp 18    SpO2 96%   Physical Exam Vitals reviewed.  Musculoskeletal:        General: Swelling and tenderness present.     Comments: 94mm puncture wound right index finger, nv and ns intact    Skin:    General: Skin is warm.  Neurological:     General: No focal deficit present.     Mental Status: He is alert.  Psychiatric:        Mood and Affect: Mood normal.    ED Results / Procedures / Treatments   Labs (all labs ordered are listed, but only abnormal results are displayed) Labs Reviewed - No data to display  EKG None  Radiology No results found.  Procedures  Procedures   Medications Ordered in ED Medications - No data to display  ED Course  I have reviewed the triage vital signs and the nursing notes.  Pertinent labs & imaging results that were available during my care of the patient were reviewed by me and considered in my medical decision making (see chart for details).    MDM Rules/Calculators/A&P                         MDM:  puncture to lateral aspect of finger,  I doubt nerve damage, no evidence of vascular damage,  dog is known, no need for rabies     Final Clinical Impression(s) / ED Diagnoses Final diagnoses:  Dog bite, initial encounter    Rx / DC Orders ED Discharge Orders          Ordered    amoxicillin-clavulanate (AUGMENTIN) 875-125 MG tablet  2 times daily        09/12/21 1623          An After Visit Summary was printed and given to the patient.     Elson Areas, [place] 09/12/21 1642    Vanetta Mulders, MD 09/15/21 1332

## 2021-09-12 NOTE — ED Notes (Signed)
Wound care at triage and then discharged by PA at triage.

## 2021-09-12 NOTE — Discharge Instructions (Addendum)
Recheck at Urgent care in 2 days.  Watch carefully for any sign of infection

## 2021-09-12 NOTE — ED Triage Notes (Signed)
Puncture wound to R index finger from dog bite from his friend's dog.  Unknown vaccinations for dog.

## 2021-09-14 ENCOUNTER — Encounter (HOSPITAL_COMMUNITY): Payer: Self-pay

## 2021-09-14 ENCOUNTER — Ambulatory Visit (HOSPITAL_COMMUNITY)
Admission: EM | Admit: 2021-09-14 | Discharge: 2021-09-14 | Disposition: A | Discharge status: Home / Self Care | Payer: Managed Care, Other (non HMO)

## 2021-09-14 ENCOUNTER — Other Ambulatory Visit: Payer: Self-pay

## 2021-09-14 DIAGNOSIS — S61250D Open bite of right index finger without damage to nail, subsequent encounter: Secondary | ICD-10-CM

## 2021-09-14 DIAGNOSIS — Z5189 Encounter for other specified aftercare: Secondary | ICD-10-CM

## 2021-09-14 NOTE — ED Provider Notes (Signed)
[facility]  ____________________________________________  Time seen: Approximately 1:13 PM  I have reviewed the triage vital signs and the nursing notes.   HISTORY  Chief Complaint Wound Check   Historian Patient     HPI Frederick Schmidt is a 25 y.o. male presents to the urgent care for a wound check.  Patient was bitten by a dog at right index finger on Sunday.  Patient was seen and evaluated and has been taking Augmentin as directed.  Patient reports that wound has been clean with no new erythema.  Patient states that he had some trace serosanguineous drainage but no expression of purulence.  No fusiform swelling, resting flexion or pain along the flexor tendon.  No fever or chills.   Past Medical History:  Diagnosis Date   Morbid obesity (HCC)      Immunizations up to date:  Yes.     Past Medical History:  Diagnosis Date   Morbid obesity [facility]     Patient Active Problem List   Diagnosis Date Noted   Chronic bilateral low back pain without sciatica 05/06/2020   Morbid obesity (HCC)     History reviewed. No pertinent surgical history.  Prior to Admission medications   Medication Sig Start Date End Date Taking? Authorizing Provider  amoxicillin (AMOXIL) 500 MG capsule Take 1 capsule (500 mg total) by mouth 3 (three) times daily. 09/02/21   Menshew, Charlesetta Ivory, PA-C  amoxicillin-clavulanate (AUGMENTIN) 875-125 MG tablet Take 1 tablet by mouth 2 (two) times daily for 10 days. 09/12/21 09/22/21  Elson Areas, PA-C  cyclobenzaprine (FLEXERIL) 10 MG tablet Take 1 tablet (10 mg total) by mouth 3 (three) times daily as needed. 07/21/20   Hoy Register, MD  diazepam (VALIUM) 5 MG tablet Take 30 minutes prior to procedure. May repeat x 1 if needed. 07/27/20   Myra Rude, MD  lidocaine (XYLOCAINE) 2 % solution Use as directed 15 mLs in the mouth or throat every 4 (four) hours as needed for mouth pain. 09/02/21   Menshew, Charlesetta Ivory, PA-C  naproxen  (NAPROSYN) 500 MG tablet Take 1 tablet (500 mg total) by mouth 2 (two) times daily. 07/21/20   Hoy Register, MD    Allergies Shellfish allergy and Blueberry flavor  Family History  Problem Relation Age of Onset   Hypertension Mother    Healthy Father     Social History Social History   Tobacco Use   Smoking status: Former   Smokeless tobacco: Never  Substance Use Topics   Alcohol use: Yes   Drug use: Yes    Types: Marijuana     Review of Systems  Constitutional: No fever/chills Eyes:  No discharge ENT: No upper respiratory complaints. Respiratory: no cough. No SOB/ use of accessory muscles to breath Gastrointestinal:   No nausea, no vomiting.  No diarrhea.  No constipation. Musculoskeletal: Patient has right index finger pain.  Skin: Negative for rash, abrasions, lacerations, ecchymosis.   ____________________________________________   PHYSICAL EXAM:  VITAL SIGNS: ED Triage Vitals  Enc Vitals Group     BP 09/14/21 1248 (!) 142/89     Pulse Rate 09/14/21 1248 60     Resp 09/14/21 1248 18     Temp 09/14/21 1248 97.8 F (36.6 C)     Temp Source 09/14/21 1248 Oral     SpO2 09/14/21 1248 98 %     Weight --      Height --      Head Circumference --  Peak Flow --      Pain Score 09/14/21 1250 0     Pain Loc --      Pain Edu? --      Excl. in GC? --      Constitutional: Alert and oriented. Well appearing and in no acute distress. Eyes: Conjunctivae are normal. PERRL. EOMI. Head: Atraumatic. ENT: Cardiovascular: Normal rate, regular rhythm. Normal S1 and S2.  Good peripheral circulation. Respiratory: Normal respiratory effort without tachypnea or retractions. Lungs CTAB. Good air entry to the bases with no decreased or absent breath sounds Gastrointestinal: Bowel sounds x 4 quadrants. Soft and nontender to palpation. No guarding or rigidity. No distention. Musculoskeletal: No sausage digit.  No resting flexion of the right index finger.  No tenderness  to palpation along the course of the flexor tendon.  Wound is clean and dry with no surrounding erythema.  Palpable radial ulnar pulses bilaterally and symmetrically.  Capillary refill less than 2 seconds on the right. Neurologic:  Normal for age. No gross focal neurologic deficits are appreciated.  Skin:  Skin is warm, dry and intact. No rash noted. Psychiatric: Mood and affect are normal for age. Speech and behavior are normal.   ____________________________________________   LABS (all labs ordered are listed, but only abnormal results are displayed)  Labs Reviewed - No data to display ____________________________________________  EKG   ____________________________________________  RADIOLOGY   No results found.  ____________________________________________    PROCEDURES  Procedure(s) performed:     Procedures     Medications - No data to display   ____________________________________________   INITIAL IMPRESSION / ASSESSMENT AND PLAN / ED COURSE  Pertinent labs & imaging results that were available during my care of the patient were reviewed by me and considered in my medical decision making (see chart for details).      Assessment and plan Visit for wound check 25 year old male presents to the urgent care to have wound check of right index finger.  Wound is clean and dry without any symptoms of flexor tenosynovitis.  Recommended continuing Augmentin as directed by emergency medicine provider.  Patient was advised that should he develop any redness, swelling or expression of purulence, he should seek care at local emergency department for IV antibiotics and further care and management.     ____________________________________________  FINAL CLINICAL IMPRESSION(S) / ED DIAGNOSES  Final diagnoses:  Visit for wound check      NEW MEDICATIONS STARTED DURING THIS VISIT:  ED Discharge Orders     None           This chart was dictated using  voice recognition software/Dragon. Despite best efforts to proofread, errors can occur which can change the meaning. Any change was purely unintentional.     Orvil Feil, PA-C 09/14/21 1317

## 2021-09-14 NOTE — ED Triage Notes (Signed)
Pt states has to wound to rt index finger from a dog bite on Sunday that needs to be checked. States has little drainage when changing the bandage last night.

## 2021-09-14 NOTE — Discharge Instructions (Signed)
Continue taking antibiotics.   Allow for wound to have 2 to 3 hours of drying time a day. If you notice any worsening pain or redness, please seek care at emergency department.

## 2022-06-20 IMAGING — XA Imaging study
2 series · 2 of 2 positions shown · non-contrast
Comparison: none

CLINICAL DATA: Lumbosacral spondylosis without myelopathy. Central
low back and left leg pain. L4-L5 spinal canal and neuroforaminal
stenosis.

[Series 1: ortho adipose · 1 of 1 slices shown (1 of 2)]
[im 1/1]
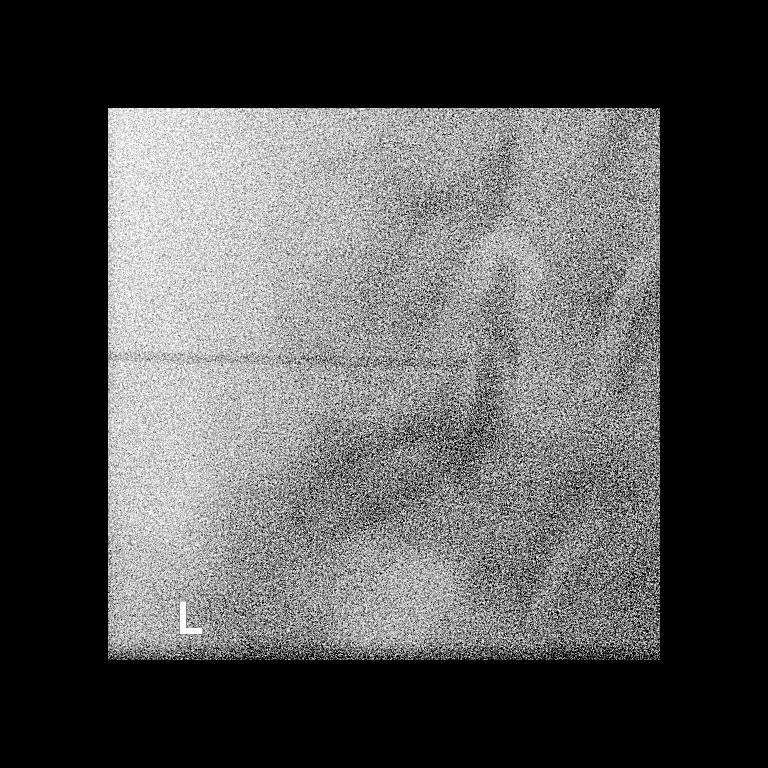

[Series 2: ortho adipose · 1 of 1 slices shown (2 of 2)]
[im 1/1]
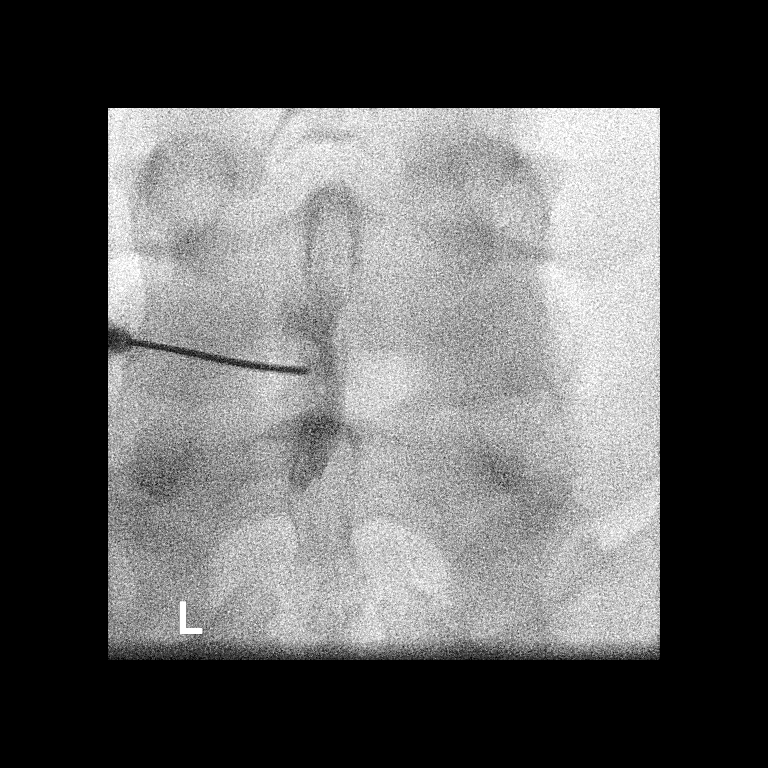

[2 of 2 positions shown; findings below may reference images not displayed]

FLUOROSCOPY TIME:  Radiation Exposure Index (as provided by the
fluoroscopic device): 7.9 mGy

Fluoroscopy Time:  33 seconds

Number of Acquired Images:  0

PROCEDURE:
The procedure, risks, benefits, and alternatives were explained to
the patient. Questions regarding the procedure were encouraged and
answered. The patient understands and consents to the procedure.

LUMBAR EPIDURAL INJECTION:

An interlaminar approach was performed on the left at L4-L5. The
overlying skin was cleansed and anesthetized. A 6 inch 20 gauge
epidural needle was advanced using loss-of-resistance technique.

DIAGNOSTIC EPIDURAL INJECTION:

Injection of Isovue-M 200 shows a good epidural pattern with spread
above and below the level of needle placement, primarily on the
left. No vascular opacification is seen.

THERAPEUTIC EPIDURAL INJECTION:

120 mg of Depo-Medrol mixed with 3 mL of 1% lidocaine were
instilled. The procedure was well-tolerated, and the patient was
discharged thirty minutes following the injection in good condition.

COMPLICATIONS:
None immediate.
IMPRESSION: Technically successful interlaminar epidural injection on the left
at L4-L5.

## 2023-01-09 ENCOUNTER — Encounter: Payer: Self-pay | Admitting: *Deleted

## 2023-06-08 ENCOUNTER — Ambulatory Visit: Admission: EM | Admit: 2023-06-08 | Discharge: 2023-06-08 | Discharge status: Against Medical Advice | Payer: Self-pay

## 2023-06-08 DIAGNOSIS — Z87891 Personal history of nicotine dependence: Secondary | ICD-10-CM | POA: Diagnosis not present

## 2023-06-08 DIAGNOSIS — N4821 Abscess of corpus cavernosum and penis: Secondary | ICD-10-CM | POA: Diagnosis not present

## 2023-06-08 DIAGNOSIS — K4090 Unilateral inguinal hernia, without obstruction or gangrene, not specified as recurrent: Secondary | ICD-10-CM | POA: Diagnosis not present

## 2023-06-08 DIAGNOSIS — S7012XA Contusion of left thigh, initial encounter: Secondary | ICD-10-CM | POA: Diagnosis not present

## 2023-06-08 DIAGNOSIS — R1030 Lower abdominal pain, unspecified: Secondary | ICD-10-CM | POA: Diagnosis not present

## 2023-06-16 DIAGNOSIS — A539 Syphilis, unspecified: Secondary | ICD-10-CM | POA: Diagnosis not present

## 2023-06-16 DIAGNOSIS — Z79899 Other long term (current) drug therapy: Secondary | ICD-10-CM | POA: Diagnosis not present

## 2023-06-16 DIAGNOSIS — Z91018 Allergy to other foods: Secondary | ICD-10-CM | POA: Diagnosis not present

## 2023-06-16 DIAGNOSIS — Z87891 Personal history of nicotine dependence: Secondary | ICD-10-CM | POA: Diagnosis not present

## 2023-06-16 DIAGNOSIS — Z91013 Allergy to seafood: Secondary | ICD-10-CM | POA: Diagnosis not present

## 2023-06-16 DIAGNOSIS — R21 Rash and other nonspecific skin eruption: Secondary | ICD-10-CM | POA: Diagnosis not present

## 2023-11-01 ENCOUNTER — Encounter (HOSPITAL_COMMUNITY): Payer: Self-pay | Admitting: Emergency Medicine

## 2023-11-01 ENCOUNTER — Ambulatory Visit (HOSPITAL_COMMUNITY)
Admission: EM | Admit: 2023-11-01 | Discharge: 2023-11-01 | Disposition: A | Discharge status: Home / Self Care | Payer: 59 | Attending: Emergency Medicine | Admitting: Emergency Medicine

## 2023-11-01 DIAGNOSIS — K047 Periapical abscess without sinus: Secondary | ICD-10-CM

## 2023-11-01 DIAGNOSIS — K0889 Other specified disorders of teeth and supporting structures: Secondary | ICD-10-CM | POA: Diagnosis not present

## 2023-11-01 MED ORDER — IBUPROFEN 800 MG PO TABS: 800.0000 mg | ORAL_TABLET | Freq: Three times a day (TID) | ORAL | 0 refills | Status: AC

## 2023-11-01 MED ORDER — AMOXICILLIN-POT CLAVULANATE 875-125 MG PO TABS: 1.0000 | ORAL_TABLET | Freq: Two times a day (BID) | ORAL | 0 refills | Status: DC

## 2023-11-01 NOTE — ED Triage Notes (Signed)
 Patient c/o front upper and lower teeth causing pain, pain is radiating to left side of mouth x 1 week.  Patient did rinse his mouth out w/peroxide and has taken Tylenol .

## 2023-11-01 NOTE — ED Provider Notes (Signed)
 [facility]    CSN: [id number] Arrival date & time: 11/01/23  0813      History   Chief Complaint Chief Complaint  Patient presents with   Dental Pain    HPI Frederick Schmidt is a 28 y.o. male.   Patient presents with left-sided dental pain and swelling that began 1 week ago.  Denies fever, facial swelling.   Dental Pain Associated symptoms: no facial swelling and no fever     Past Medical History:  Diagnosis Date   Morbid obesity [facility])     Patient Active Problem List   Diagnosis Date Noted   Chronic bilateral low back pain without sciatica 05/06/2020   Morbid obesity (HCC)     History reviewed. No pertinent surgical history.     Home Medications    Prior to Admission medications   Medication Sig Start Date End Date Taking? Authorizing Provider  amoxicillin -clavulanate (AUGMENTIN ) 875-125 MG tablet Take 1 tablet by mouth every 12 (twelve) hours. 11/01/23  Yes Johnie, Josseline Reddin A, NP  ibuprofen  (ADVIL ) 800 MG tablet Take 1 tablet (800 mg total) by mouth 3 (three) times daily. 11/01/23  Yes Johnie Rumaldo LABOR, NP    Family History Family History  Problem Relation Age of Onset   Hypertension Mother    Healthy Father     Social History Social History   Tobacco Use   Smoking status: Former   Smokeless tobacco: Never  Advertising Account Planner   Vaping status: Never Used  Substance Use Topics   Alcohol use: Yes   Drug use: Yes    Types: Marijuana     Allergies   Shellfish allergy and Blueberry flavoring agent (non-screening)   Review of Systems Review of Systems  Constitutional:  Negative for fever.  HENT:  Positive for dental problem. Negative for facial swelling.      Physical Exam Triage Vital Signs ED Triage Vitals  Encounter Vitals Group     BP 11/01/23 0901 (!) 151/85     Systolic BP Percentile --      Diastolic BP Percentile --      Pulse Rate 11/01/23 0901 85     Resp 11/01/23 0901 18     Temp 11/01/23 0901 98.2 F (36.8 C)     Temp  Source 11/01/23 0901 Oral     SpO2 11/01/23 0901 98 %     Weight 11/01/23 0903 (!) 315 lb (142.9 kg)     Height 11/01/23 0903 6' (1.829 m)     Head Circumference --      Peak Flow --      Pain Score 11/01/23 0903 10     Pain Loc --      Pain Education --      Exclude from Growth Chart --    No data found.  Updated Vital Signs BP (!) 151/85 (BP Location: Left Arm)   Pulse 85   Temp 98.2 F (36.8 C) (Oral)   Resp 18   Ht 6' (1.829 m)   Wt (!) 315 lb (142.9 kg)   SpO2 98%   BMI 42.72 kg/m   Visual Acuity Right Eye Distance:   Left Eye Distance:   Bilateral Distance:    Right Eye Near:   Left Eye Near:    Bilateral Near:     Physical Exam Vitals and nursing note reviewed.  Constitutional:      General: He is awake. He is not in acute distress.    Appearance: Normal appearance. He is  well-developed and well-groomed. He is not ill-appearing.  HENT:     Mouth/Throat:     Dentition: Dental tenderness and gingival swelling present. No dental abscesses.     Comments: Mild gingival swelling and tenderness noted to left lower mouth.  Without abscess present Neurological:     Mental Status: He is alert.  Psychiatric:        Behavior: Behavior is cooperative.      UC Treatments / Results  Labs (all labs ordered are listed, but only abnormal results are displayed) Labs Reviewed - No data to display  EKG   Radiology No results found.  Procedures Procedures (including critical care time)  Medications Ordered in UC Medications - No data to display  Initial Impression / Assessment and Plan / UC Course  I have reviewed the triage vital signs and the nursing notes.  Pertinent labs & imaging results that were available during my care of the patient were reviewed by me and considered in my medical decision making (see chart for details).     Patient presented with left-sided dental pain and swelling that began 1 week ago.  Upon assessment patient has mild  gingival swelling and tenderness noted to left lower mouth.  Without abscess present.    Prescribed Augmentin  for dental infection coverage.  Prescribed 800 mg ibuprofen  as needed for pain.  Given dental resources.  Discussed follow-up and return precautions. Final Clinical Impressions(s) / UC Diagnoses   Final diagnoses:  Pain, dental  Dental infection     Discharge Instructions      Start taking Augmentin  twice daily for 7 days for dental infection coverage.  You can alternate between ibuprofen  and Tylenol  every 4-6 hours as needed for pain.  I have attached dental resources that you can follow-up with regarding dental pain.  Return here as needed.    ED Prescriptions     Medication Sig Dispense Auth. Provider   amoxicillin -clavulanate (AUGMENTIN ) 875-125 MG tablet Take 1 tablet by mouth every 12 (twelve) hours. 14 tablet Johnie Flaming A, NP   ibuprofen  (ADVIL ) 800 MG tablet Take 1 tablet (800 mg total) by mouth 3 (three) times daily. 21 tablet Johnie Flaming A, NP      PDMP not reviewed this encounter.   Johnie Flaming A, NP 11/01/23 1003

## 2023-11-01 NOTE — Discharge Instructions (Signed)
 Start taking Augmentin  twice daily for 7 days for dental infection coverage.  You can alternate between ibuprofen  and Tylenol  every 4-6 hours as needed for pain.  I have attached dental resources that you can follow-up with regarding dental pain.  Return here as needed.

## 2024-01-01 ENCOUNTER — Emergency Department

## 2024-01-01 ENCOUNTER — Other Ambulatory Visit: Payer: Self-pay

## 2024-01-01 ENCOUNTER — Emergency Department
Admission: EM | Admit: 2024-01-01 | Discharge: 2024-01-01 | Disposition: A | Discharge status: Home / Self Care | Attending: Emergency Medicine | Admitting: Emergency Medicine

## 2024-01-01 DIAGNOSIS — N200 Calculus of kidney: Secondary | ICD-10-CM | POA: Diagnosis not present

## 2024-01-01 DIAGNOSIS — R109 Unspecified abdominal pain: Secondary | ICD-10-CM | POA: Diagnosis not present

## 2024-01-01 DIAGNOSIS — N201 Calculus of ureter: Secondary | ICD-10-CM | POA: Diagnosis not present

## 2024-01-01 DIAGNOSIS — N132 Hydronephrosis with renal and ureteral calculous obstruction: Secondary | ICD-10-CM | POA: Diagnosis not present

## 2024-01-01 DIAGNOSIS — R1032 Left lower quadrant pain: Secondary | ICD-10-CM | POA: Diagnosis present

## 2024-01-01 LAB — COMPREHENSIVE METABOLIC PANEL WITH GFR
ALT: 26 U/L (ref 0–44)
AST: 34 U/L (ref 15–41)
Albumin: 4 g/dL (ref 3.5–5.0)
Alkaline Phosphatase: 45 U/L (ref 38–126)
Anion gap: 9 (ref 5–15)
BUN: 25 mg/dL — ABNORMAL HIGH (ref 6–20)
CO2: 24 mmol/L (ref 22–32)
Calcium: 9.1 mg/dL (ref 8.9–10.3)
Chloride: 109 mmol/L (ref 98–111)
Creatinine, Ser: 1.89 mg/dL — ABNORMAL HIGH (ref 0.61–1.24)
GFR, Estimated: 49 mL/min — ABNORMAL LOW (ref >60)
Glucose, Bld: 110 mg/dL — ABNORMAL HIGH (ref 70–99)
Potassium: 4.3 mmol/L (ref 3.5–5.1)
Sodium: 142 mmol/L (ref 135–145)
Total Bilirubin: 0.4 mg/dL (ref 0.0–1.2)
Total Protein: 7.7 g/dL (ref 6.5–8.1)

## 2024-01-01 LAB — CBC
HCT: 48.1 % (ref 39.0–52.0)
Hemoglobin: 15.6 g/dL (ref 13.0–17.0)
MCH: 27.8 pg (ref 26.0–34.0)
MCHC: 32.4 g/dL (ref 30.0–36.0)
MCV: 85.6 fL (ref 80.0–100.0)
Platelets: 265 10*3/uL (ref 150–400)
RBC: 5.62 MIL/uL (ref 4.22–5.81)
RDW: 15.2 % (ref 11.5–15.5)
WBC: 6.3 10*3/uL (ref 4.0–10.5)
nRBC: 0 % (ref 0.0–0.2)

## 2024-01-01 LAB — URINALYSIS, ROUTINE W REFLEX MICROSCOPIC
Bilirubin Urine: NEGATIVE
Glucose, UA: NEGATIVE mg/dL
Ketones, ur: NEGATIVE mg/dL
Leukocytes,Ua: NEGATIVE
Nitrite: NEGATIVE
Protein, ur: NEGATIVE mg/dL
RBC / HPF: >50 RBC/hpf (ref 0–5)
Specific Gravity, Urine: 1.024 (ref 1.005–1.030)
pH: 6 (ref 5.0–8.0)

## 2024-01-01 LAB — LIPASE, BLOOD: Lipase: 29 U/L (ref 11–51)

## 2024-01-01 MED ORDER — NAPROXEN 500 MG PO TABS: 500.0000 mg | ORAL_TABLET | Freq: Two times a day (BID) | ORAL | 2 refills | Status: AC

## 2024-01-01 MED ORDER — KETOROLAC TROMETHAMINE 30 MG/ML IJ SOLN
30.0000 mg | Freq: Once | INTRAMUSCULAR | Status: AC
  Administered 2024-01-01: 30 mg via INTRAVENOUS
  Filled 2024-01-01: qty 1

## 2024-01-01 MED ORDER — ONDANSETRON HCL 4 MG/2ML IJ SOLN
4.0000 mg | Freq: Once | INTRAMUSCULAR | Status: AC
  Administered 2024-01-01: 4 mg via INTRAVENOUS
  Filled 2024-01-01: qty 2

## 2024-01-01 MED ORDER — MORPHINE SULFATE (PF) 4 MG/ML IV SOLN
4.0000 mg | Freq: Once | INTRAVENOUS | Status: AC
  Administered 2024-01-01: 4 mg via INTRAVENOUS
  Filled 2024-01-01: qty 1

## 2024-01-01 MED ORDER — TAMSULOSIN HCL 0.4 MG PO CAPS: 0.4000 mg | ORAL_CAPSULE | Freq: Every day | ORAL | 0 refills | Status: DC

## 2024-01-01 MED ORDER — HYDROCODONE-ACETAMINOPHEN 5-325 MG PO TABS: 1.0000 | ORAL_TABLET | Freq: Four times a day (QID) | ORAL | 0 refills | Status: AC | PRN

## 2024-01-01 MED ORDER — HYDROCODONE-ACETAMINOPHEN 5-325 MG PO TABS
1.0000 | ORAL_TABLET | Freq: Once | ORAL | Status: AC
  Administered 2024-01-01: 1 via ORAL
  Filled 2024-01-01: qty 1

## 2024-01-01 MED ORDER — ONDANSETRON 4 MG PO TBDP: 4.0000 mg | ORAL_TABLET | Freq: Three times a day (TID) | ORAL | 0 refills | Status: DC | PRN

## 2024-01-01 MED ORDER — SODIUM CHLORIDE 0.9 % IV BOLUS
500.0000 mL | Freq: Once | INTRAVENOUS | Status: AC
  Administered 2024-01-01: 500 mL via INTRAVENOUS

## 2024-01-01 NOTE — ED Provider Notes (Signed)
 [facility] Provider Note    Event Date/Time   First MD Initiated Contact with Patient 01/01/24 [phone]     (approximate)   History   Abdominal Pain   HPI  Frederick Schmidt is a 28 y.o. male who presents with complaints of left lower quadrant/flank pain which developed overnight.  No history of kidney stones, no dysuria, no hematuria reported.  Reports the pain is severe, mild nausea, no history of the same     Physical Exam   Triage Vital Signs: ED Triage Vitals  Encounter Vitals Group     BP 01/01/24 0743 (!) 160/109     Systolic BP Percentile --      Diastolic BP Percentile --      Pulse Rate 01/01/24 0743 (!) 59     Resp 01/01/24 0743 20     Temp 01/01/24 0743 (!) 97.5 F (36.4 C)     Temp Source 01/01/24 0743 Axillary     SpO2 01/01/24 0743 100 %     Weight 01/01/24 0825 (!) 142 kg (313 lb 0.9 oz)     Height 01/01/24 0825 1.829 m (6')     Head Circumference --      Peak Flow --      Pain Score 01/01/24 0750 10     Pain Loc --      Pain Education --      Exclude from Growth Chart --     Most recent vital signs: Vitals:   01/01/24 0743 01/01/24 1202  BP: (!) 160/109 (!) 150/94  Pulse: (!) 59 60  Resp: 20 20  Temp: (!) 97.5 F (36.4 C) 98 F (36.7 C)  SpO2: 100% 100%     General: Awake, diaphoretic CV:  Good peripheral perfusion.  Resp:  Normal effort.  Abd:  No distention.  No CVA tenderness, no left lower quadrant tenderness to palpation Other:     ED Results / Procedures / Treatments   Labs (all labs ordered are listed, but only abnormal results are displayed) Labs Reviewed  COMPREHENSIVE METABOLIC PANEL WITH GFR - Abnormal; Notable for the following components:      Result Value   Glucose, Bld 110 (*)    BUN 25 (*)    Creatinine, Ser 1.89 (*)    GFR, Estimated 49 (*)    All other components within normal limits  URINALYSIS, ROUTINE W REFLEX MICROSCOPIC - Abnormal; Notable for the following components:   Color, Urine  YELLOW (*)    APPearance CLEAR (*)    Hgb urine dipstick LARGE (*)    Bacteria, UA RARE (*)    All other components within normal limits  LIPASE, BLOOD  CBC     EKG     RADIOLOGY CT scan viewed and interpreted by me, UVJ stone noted    PROCEDURES:  Critical Care performed:   Procedures   MEDICATIONS ORDERED IN ED: Medications  sodium chloride 0.9 % bolus 500 mL (0 mLs Intravenous Stopped 01/01/24 1220)  ketorolac (TORADOL) 30 MG/ML injection 30 mg (30 mg Intravenous Given 01/01/24 0938)  ondansetron (ZOFRAN) injection 4 mg (4 mg Intravenous Given 01/01/24 0938)  morphine (PF) 4 MG/ML injection 4 mg (4 mg Intravenous Given 01/01/24 1013)  ondansetron (ZOFRAN) injection 4 mg (4 mg Intravenous Given 01/01/24 1013)  HYDROcodone-acetaminophen (NORCO/VICODIN) 5-325 MG per tablet 1 tablet (1 tablet Oral Given 01/01/24 1217)     IMPRESSION / MDM / ASSESSMENT AND PLAN / ED COURSE  I reviewed the  triage vital signs and the nursing notes. Patient's presentation is most consistent with acute presentation with potential threat to life or bodily function.  Patient presents with severe abdominal/flank pain as detailed above, differential includes diverticulitis, ureterolithiasis, UTI, pyelonephritis  Will obtain urine, treat with IV Toradol, IV fluids, IV Zofran, obtain CT renal stone study, obtain labs and reevaluate.  Urinalysis demonstrates hematuria, suspicious for ureterolithiasis  Patient with continued pain after Toradol, will treat with IV morphine  ----------------------------------------- 1:00 PM on 01/01/2024 ----------------------------------------- Patient's pain resolved patient's pain resolved after morphine, he is feeling well, no signs of infection, return precautions of any fevers, dysuria worsening pain, patient agrees with this plan      FINAL CLINICAL IMPRESSION(S) / ED DIAGNOSES   Final diagnoses:  Kidney stone     Rx / DC Orders   ED Discharge Orders           Ordered    naproxen (NAPROSYN) 500 MG tablet  2 times daily with meals        01/01/24 1207    ondansetron (ZOFRAN-ODT) 4 MG disintegrating tablet  Schmidt 8 hours PRN        01/01/24 1207    HYDROcodone-acetaminophen (NORCO/VICODIN) 5-325 MG tablet  Schmidt 6 hours PRN        01/01/24 1207    tamsulosin (FLOMAX) 0.4 MG CAPS capsule  Daily        01/01/24 1207             Note:  This document was prepared using Dragon voice recognition software and may include unintentional dictation errors.   Frederick Every, MD 01/01/24 [phone]

## 2024-01-01 NOTE — ED Triage Notes (Signed)
 Patient complaining of left sided abdominal pain that started this morning; denies N/V/D. Patient is diaphoretic in triage.

## 2024-01-01 NOTE — ED Notes (Signed)
 See triage note  Presents with some left sided abd pain which is moving into groin  States pain started early this am  No fever or n/v

## 2024-03-03 ENCOUNTER — Ambulatory Visit (HOSPITAL_COMMUNITY)
Admission: EM | Admit: 2024-03-03 | Discharge: 2024-03-03 | Disposition: A | Discharge status: Home / Self Care | Attending: Physician Assistant | Admitting: Physician Assistant

## 2024-03-03 ENCOUNTER — Encounter (HOSPITAL_COMMUNITY): Payer: Self-pay

## 2024-03-03 DIAGNOSIS — N342 Other urethritis: Secondary | ICD-10-CM | POA: Diagnosis not present

## 2024-03-03 MED ORDER — CEFTRIAXONE SODIUM 250 MG IJ SOLR
500.0000 mg | Freq: Once | INTRAMUSCULAR | Status: AC
  Administered 2024-03-03: 500 mg via INTRAMUSCULAR

## 2024-03-03 MED ORDER — DOXYCYCLINE HYCLATE 100 MG PO CAPS: 100.0000 mg | ORAL_CAPSULE | Freq: Two times a day (BID) | ORAL | 0 refills | Status: DC

## 2024-03-03 MED ORDER — LIDOCAINE HCL (PF) 1 % IJ SOLN
INTRAMUSCULAR | Status: AC
Start: 2024-03-03 — End: ?
  Filled 2024-03-03: qty 2

## 2024-03-03 MED ORDER — CEFTRIAXONE SODIUM 500 MG IJ SOLR
INTRAMUSCULAR | Status: AC
  Filled 2024-03-03: qty 500

## 2024-03-03 NOTE — Discharge Instructions (Signed)
 Return if any problems.

## 2024-03-03 NOTE — ED Triage Notes (Signed)
 Patient here today with c/o penile discharge since yesterday.

## 2024-03-03 NOTE — ED Provider Notes (Signed)
 [facility]    CSN: [id number] Arrival date & time: 03/03/24  1117      History   Chief Complaint Chief Complaint  Patient presents with   Penile Discharge    HPI Frederick Schmidt is a 28 y.o. male.   Patient complains of a penile discharge.  Patient is concerned that he has something sexually transmitted.  He does not know if his partner has had any symptoms.  He denies any fever or chills he has not had any nausea he has not had any abdominal pain.  Patient declines HIV and syphilis testing.  He request treatment for possible gonorrhea chlamydia  The history is provided by the patient. No language interpreter was used.  Penile Discharge    Past Medical History:  Diagnosis Date   Morbid obesity [facility])     Patient Active Problem List   Diagnosis Date Noted   Chronic bilateral low back pain without sciatica 05/06/2020   Morbid obesity (HCC)     History reviewed. No pertinent surgical history.     Home Medications    Prior to Admission medications   Medication Sig Start Date End Date Taking? Authorizing Provider  HYDROcodone -acetaminophen  (NORCO/VICODIN) 5-325 MG tablet Take 1 tablet by mouth every 6 (six) hours as needed for moderate pain (pain score 4-6). 01/01/24   Bryson Carbine, MD  ibuprofen  (ADVIL ) 800 MG tablet Take 1 tablet (800 mg total) by mouth 3 (three) times daily. 11/01/23   Levora Reas A, NP  naproxen  (NAPROSYN ) 500 MG tablet Take 1 tablet (500 mg total) by mouth 2 (two) times daily with a meal. 01/01/24   Bryson Carbine, MD    Family History Family History  Problem Relation Age of Onset   Hypertension Mother    Healthy Father     Social History Social History   Tobacco Use   Smoking status: Former   Smokeless tobacco: Never  Advertising account planner   Vaping status: Never Used  Substance Use Topics   Alcohol use: Yes   Drug use: Yes    Types: Marijuana     Allergies   Shellfish allergy and Blueberry flavoring agent  (non-screening)   Review of Systems Review of Systems  Genitourinary:  Positive for penile discharge.  All other systems reviewed and are negative.    Physical Exam Triage Vital Signs ED Triage Vitals [03/03/24 1129]  Encounter Vitals Group     BP (!) 145/82     Systolic BP Percentile      Diastolic BP Percentile      Pulse Rate 60     Resp 16     Temp 97.9 F (36.6 C)     Temp Source Oral     SpO2 97 %     Weight      Height      Head Circumference      Peak Flow      Pain Score 0     Pain Loc      Pain Education      Exclude from Growth Chart    No data found.  Updated Vital Signs BP (!) 145/82 (BP Location: Right Arm)   Pulse 60   Temp 97.9 F (36.6 C) (Oral)   Resp 16   SpO2 97%   Visual Acuity Right Eye Distance:   Left Eye Distance:   Bilateral Distance:    Right Eye Near:   Left Eye Near:    Bilateral Near:     Physical Exam  Vitals and nursing note reviewed.  Constitutional:      Appearance: He is well-developed.  HENT:     Head: Normocephalic.  Cardiovascular:     Rate and Rhythm: Normal rate.  Pulmonary:     Effort: Pulmonary effort is normal.  Musculoskeletal:        General: Normal range of motion.  Skin:    General: Skin is warm.  Neurological:     General: No focal deficit present.     Mental Status: He is alert and oriented to person, place, and time.      UC Treatments / Results  Labs (all labs ordered are listed, but only abnormal results are displayed) Labs Reviewed - No data to display  EKG   Radiology No results found.  Procedures Procedures (including critical care time)  Medications Ordered in UC Medications - No data to display  Initial Impression / Assessment and Plan / UC Course  I have reviewed the triage vital signs and the nursing notes.  Pertinent labs & imaging results that were available during my care of the patient were reviewed by me and considered in my medical decision making (see chart for  details).     GC and Chlamydia testing ordered.  Patient is given an injection of Rocephin  and a prescription for doxycycline .  He is discharged in stable condition Final Clinical Impressions(s) / UC Diagnoses   Final diagnoses:  Urethritis   Discharge Instructions   None    ED Prescriptions     Medication Sig Dispense Auth. Provider   doxycycline  (VIBRAMYCIN ) 100 MG capsule Take 1 capsule (100 mg total) by mouth 2 (two) times daily. 14 capsule Barnard Sharps K, PA-C      PDMP not reviewed this encounter. An After Visit Summary was printed and given to the patient.       Sandi Crosby, PA-C 03/03/24 1145

## 2024-03-05 LAB — CYTOLOGY, (ORAL, ANAL, URETHRAL) ANCILLARY ONLY
Chlamydia: POSITIVE — AB
Comment: NEGATIVE
Comment: NEGATIVE
Comment: NORMAL
Neisseria Gonorrhea: POSITIVE — AB
Trichomonas: NEGATIVE

## 2024-03-06 ENCOUNTER — Ambulatory Visit (HOSPITAL_COMMUNITY): Payer: Self-pay

## 2024-04-30 ENCOUNTER — Telehealth: Payer: Self-pay | Admitting: Pharmacy Technician

## 2024-04-30 ENCOUNTER — Encounter: Payer: Self-pay | Admitting: Nurse Practitioner

## 2024-04-30 ENCOUNTER — Other Ambulatory Visit (HOSPITAL_COMMUNITY): Payer: Self-pay

## 2024-04-30 ENCOUNTER — Ambulatory Visit (INDEPENDENT_AMBULATORY_CARE_PROVIDER_SITE_OTHER): Admitting: Nurse Practitioner

## 2024-04-30 DIAGNOSIS — Z130 Encounter for screening for diseases of the blood and blood-forming organs and certain disorders involving the immune mechanism: Secondary | ICD-10-CM | POA: Diagnosis not present

## 2024-04-30 DIAGNOSIS — Z113 Encounter for screening for infections with a predominantly sexual mode of transmission: Secondary | ICD-10-CM | POA: Diagnosis not present

## 2024-04-30 DIAGNOSIS — Z7689 Persons encountering health services in other specified circumstances: Secondary | ICD-10-CM

## 2024-04-30 DIAGNOSIS — Z13220 Encounter for screening for lipoid disorders: Secondary | ICD-10-CM | POA: Diagnosis not present

## 2024-04-30 DIAGNOSIS — Z114 Encounter for screening for human immunodeficiency virus [HIV]: Secondary | ICD-10-CM | POA: Diagnosis not present

## 2024-04-30 DIAGNOSIS — Z1159 Encounter for screening for other viral diseases: Secondary | ICD-10-CM | POA: Diagnosis not present

## 2024-04-30 DIAGNOSIS — I10 Essential (primary) hypertension: Secondary | ICD-10-CM | POA: Diagnosis not present

## 2024-04-30 DIAGNOSIS — G43009 Migraine without aura, not intractable, without status migrainosus: Secondary | ICD-10-CM | POA: Diagnosis not present

## 2024-04-30 DIAGNOSIS — Z131 Encounter for screening for diabetes mellitus: Secondary | ICD-10-CM

## 2024-04-30 MED ORDER — ZAVZPRET 10 MG/ACT NA SOLN
1.0000 | NASAL | 1 refills | Status: DC | PRN
Start: 2024-04-30 — End: 2024-06-11

## 2024-04-30 MED ORDER — AMLODIPINE BESYLATE 5 MG PO TABS: 5.0000 mg | ORAL_TABLET | Freq: Every day | ORAL | 0 refills | Status: DC

## 2024-04-30 NOTE — Telephone Encounter (Signed)
 Pharmacy Patient Advocate Encounter   Received notification from CoverMyMeds that prior authorization for Zavzpret  10MG /ACT solution is required/requested.   Insurance verification completed.   The patient is insured through [facility] .   Per test claim: PA required; PA submitted to above mentioned insurance via CoverMyMeds Key/confirmation #/EOC [id number] Status is pending

## 2024-04-30 NOTE — Progress Notes (Signed)
 BP (!) 140/98   Pulse 78   Resp 18   Ht 5' 9 (1.753 m)   Wt (!) 343 lb 6.4 oz (155.8 kg)   SpO2 96%   BMI 50.71 kg/m    Subjective:    Patient ID: Frederick Schmidt, male    DOB: 04-15-1996, 28 y.o.   MRN: [id number]  HPI: Frederick Schmidt is a 28 y.o. male  Chief Complaint  Patient presents with   Establish Care   Hypertension    High readings at home especially when in pain   Headache    Has been having them since he was a kid pt believes related to migraines but no provider has really dived into it. Headaches can last up to 3-4 days getting some relief in the darkness.    Discussed the use of AI scribe software for clinical note transcription with the patient, who gave verbal consent to proceed.  History of Present Illness Frederick Schmidt is a 28 year old male who presents with elevated blood pressure and headaches to establish care.  Elevated blood pressure - Elevated blood pressure readings for the past 1.5 years - Home measurements consistently high, lowest recorded at 130/85 mmHg - Notable readings: 145/82 mmHg on March 03, 2024; 160/109 mmHg on January 01, 2024 - Never used antihypertensive medication - Family history of hypertension in mother and grandmother  Cephalgia (headaches) - Headaches since childhood, occurring a couple of times per month - Headaches last from one day to several days - Described as a heavy sensation across the frontal region of the head - No associated nausea, visual changes, or confusion - Partial relief with darkness and hydration - Ibuprofen  ineffective - Past prescription for a migraine medication containing caffeine provided some relief  Weight gain and increased appetite - Significant weight gain over the past two years - Increased hunger despite unchanged eating habits Waist Measurement : 59 inches  Filed Weights   04/30/24 1038  Weight: (!) 343 lb 6.4 oz (155.8 kg)    Body mass index is 50.71 kg/m.    Supplement use - Used  weightlifting supplements once weekly for approximately one year - Discontinued supplement use after noticing elevated blood pressure - Supplements possibly contained caffeine, suspected to contribute to hypertension         04/30/2024   10:35 AM  Depression screen PHQ 2/9  Decreased Interest 0  Down, Depressed, Hopeless 0  PHQ - 2 Score 0    Relevant past medical, surgical, family and social history reviewed and updated as indicated. Interim medical history since our last visit reviewed. Allergies and medications reviewed and updated.  Review of Systems  Constitutional: Negative for fever or weight change.  Respiratory: Negative for cough and shortness of breath.   Cardiovascular: Negative for chest pain or palpitations.  Gastrointestinal: Negative for abdominal pain, no bowel changes.  Musculoskeletal: Negative for gait problem or joint swelling.  Skin: Negative for rash.  Neurological: Negative for dizziness, positive for headache.  No other specific complaints in a complete review of systems (except as listed in HPI above).      Objective:     BP (!) 140/98   Pulse 78   Resp 18   Ht 5' 9 (1.753 m)   Wt (!) 343 lb 6.4 oz (155.8 kg)   SpO2 96%   BMI 50.71 kg/m    Wt Readings from Last 3 Encounters:  04/30/24 (!) 343 lb 6.4 oz (155.8 kg)  01/01/24 (!) 313 lb  0.9 oz (142 kg)  11/01/23 (!) 315 lb (142.9 kg)    Physical Exam Physical Exam VITALS: BP- 148/92 MEASUREMENTS: Weight- 343, BMI- 50.0. GENERAL: Alert, cooperative, well developed, no acute distress HEENT: Normocephalic, normal oropharynx, moist mucous membranes CHEST: Clear to auscultation bilaterally, no wheezes, rhonchi, or crackles CARDIOVASCULAR: Normal heart rate and rhythm, S1 and S2 normal without murmurs ABDOMEN: Soft, non-tender, non-distended, without organomegaly, normal bowel sounds EXTREMITIES: No cyanosis or edema NEUROLOGICAL: Cranial nerves grossly intact, moves all extremities without  gross motor or sensory deficit, motor function normal, no deficits   Results for orders placed or performed during the hospital encounter of 03/03/24  Cytology Ancillary Only -Urethral; GC / Chlamydia, Trichomonas   Collection Time: 03/03/24 11:42 AM  Result Value Ref Range   Neisseria Gonorrhea Positive (A)    Chlamydia Positive (A)    Trichomonas Negative    Comment Normal Reference Ranger Chlamydia - Negative    Comment      Normal Reference Range Neisseria Gonorrhea - Negative   Comment Normal Reference Range Trichomonas - Negative           Assessment & Plan:   Problem List Items Addressed This Visit       Cardiovascular and Mediastinum   Primary hypertension   Relevant Medications   amLODipine  (NORVASC ) 5 MG tablet   Migraine without aura and without status migrainosus, not intractable   Relevant Medications   amLODipine  (NORVASC ) 5 MG tablet   Zavegepant HCl (ZAVZPRET ) 10 MG/ACT SOLN     Other   Morbid obesity (HCC) - Primary   Relevant Orders   TSH   Other Visit Diagnoses       Encounter to establish care         Screening for deficiency anemia       Relevant Orders   CBC with Differential/Platelet     Screening for HIV without presence of risk factors       Relevant Orders   HIV Antibody (routine testing w rflx)     Screening for cholesterol level       Relevant Orders   Lipid panel     Screening for diabetes mellitus       Relevant Orders   Comprehensive metabolic panel with GFR   Hemoglobin A1c     Encounter for hepatitis C screening test for low risk patient       Relevant Orders   Hepatitis C antibody     Screening examination for STD (sexually transmitted disease)       Relevant Orders   RPR   HIV Antibody (routine testing w rflx)   Hepatitis C antibody        Assessment and Plan Assessment & Plan Essential hypertension Hypertension for approximately 1.5 years with multiple elevated readings, including 148/92 mmHg in the office today.  Family history of hypertension. Home readings consistently elevated around 140/95 mmHg. No prior antihypertensive treatment. Potential contributing factors include weightlifting supplements possibly containing caffeine. - Start amlodipine  5 mg daily. - Recheck kidney function to assess GFR, as it was previously low. - Monitor blood pressure at home and report readings via MyChart. - Review supplement ingredients for potential hypertensive effects.  Obesity BMI of 50.0 with significant weight gain over the past two years. Increased hunger possibly related to increased physical activity or other metabolic factors. - Check thyroid function and A1c to assess for metabolic contributors to weight gain and hunger.  Tension-type headache Chronic headaches since childhood, occurring a few  times a month, lasting from one to several days. Described as a band-like pressure across the front of the head, consistent with tension-type headaches. Ibuprofen  provides little relief. Differential includes tension-type headache and migraine. Potential contributing factors include elevated blood pressure and possible dehydration. - Order lab work to assess for underlying causes, including dehydration and metabolic issues. - Consider trial of Zavzpret  nasal spray for headache management, pending insurance approval.        Follow up plan: Return in about 4 weeks (around 05/28/2024) for follow up.

## 2024-05-01 ENCOUNTER — Other Ambulatory Visit (HOSPITAL_COMMUNITY): Payer: Self-pay

## 2024-05-02 NOTE — Telephone Encounter (Signed)
 Pharmacy Patient Advocate Encounter  Received notification from [facility] that Prior Authorization for Zavzpret  10MG /ACT solution has been DENIED.  Full denial letter will be uploaded to the media tab. See denial reason below.     PA #/Case ID/Reference #: [id number]

## 2024-05-03 ENCOUNTER — Ambulatory Visit: Payer: Self-pay | Admitting: Nurse Practitioner

## 2024-05-03 LAB — T PALLIDUM AB: T Pallidum Abs: POSITIVE — AB

## 2024-05-03 LAB — COMPREHENSIVE METABOLIC PANEL WITH GFR
AG Ratio: 1.7 (calc) (ref 1.0–2.5)
ALT: 18 U/L (ref 9–46)
AST: 22 U/L (ref 10–40)
Albumin: 4.3 g/dL (ref 3.6–5.1)
Alkaline phosphatase (APISO): 53 U/L (ref 36–130)
BUN/Creatinine Ratio: 10 (calc) (ref 6–22)
BUN: 14 mg/dL (ref 7–25)
CO2: 27 mmol/L (ref 20–32)
Calcium: 9.4 mg/dL (ref 8.6–10.3)
Chloride: 107 mmol/L (ref 98–110)
Creat: 1.41 mg/dL — ABNORMAL HIGH (ref 0.60–1.26)
Globulin: 2.6 g/dL (ref 1.9–3.7)
Glucose, Bld: 91 mg/dL (ref 65–99)
Potassium: 5.1 mmol/L (ref 3.5–5.3)
Sodium: 141 mmol/L (ref 135–146)
Total Bilirubin: 0.5 mg/dL (ref 0.2–1.2)
Total Protein: 6.9 g/dL (ref 6.1–8.1)
eGFR: 69 mL/min/1.73m2 (ref >=60)

## 2024-05-03 LAB — HEMOGLOBIN A1C
Hgb A1c MFr Bld: 5.5 % (ref <5.7)
Mean Plasma Glucose: 111 mg/dL
eAG (mmol/L): 6.2 mmol/L

## 2024-05-03 LAB — CBC WITH DIFFERENTIAL/PLATELET
Absolute Lymphocytes: 1335 {cells}/uL (ref 850–3900)
Absolute Monocytes: 420 {cells}/uL (ref 200–950)
Basophils Absolute: 30 {cells}/uL (ref 0–200)
Basophils Relative: 0.6 %
Eosinophils Absolute: 60 {cells}/uL (ref 15–500)
Eosinophils Relative: 1.2 %
HCT: 44.8 % (ref 38.5–50.0)
Hemoglobin: 14.3 g/dL (ref 13.2–17.1)
MCH: 27.3 pg (ref 27.0–33.0)
MCHC: 31.9 g/dL — ABNORMAL LOW (ref 32.0–36.0)
MCV: 85.7 fL (ref 80.0–100.0)
MPV: 10.1 fL (ref 7.5–12.5)
Monocytes Relative: 8.4 %
Neutro Abs: 3155 {cells}/uL (ref 1500–7800)
Neutrophils Relative %: 63.1 %
Platelets: 269 Thousand/uL (ref 140–400)
RBC: 5.23 Million/uL (ref 4.20–5.80)
RDW: 14.3 % (ref 11.0–15.0)
Total Lymphocyte: 26.7 %
WBC: 5 Thousand/uL (ref 3.8–10.8)

## 2024-05-03 LAB — RPR TITER: RPR Titer: 1:1 {titer} — ABNORMAL HIGH

## 2024-05-03 LAB — LIPID PANEL
Cholesterol: 169 mg/dL (ref <200)
HDL: 45 mg/dL (ref >=40)
LDL Cholesterol (Calc): 110 mg/dL — ABNORMAL HIGH
Non-HDL Cholesterol (Calc): 124 mg/dL (ref <130)
Total CHOL/HDL Ratio: 3.8 (calc) (ref <5.0)
Triglycerides: 49 mg/dL (ref <150)

## 2024-05-03 LAB — HIV ANTIBODY (ROUTINE TESTING W REFLEX): HIV 1&2 Ab, 4th Generation: NONREACTIVE

## 2024-05-03 LAB — RPR: RPR Ser Ql: REACTIVE — AB

## 2024-05-03 LAB — HEPATITIS C ANTIBODY: Hepatitis C Ab: NONREACTIVE

## 2024-05-03 LAB — TSH: TSH: 1.11 m[IU]/L (ref 0.40–4.50)

## 2024-05-25 ENCOUNTER — Other Ambulatory Visit: Payer: Self-pay | Admitting: Nurse Practitioner

## 2024-05-25 DIAGNOSIS — I10 Essential (primary) hypertension: Secondary | ICD-10-CM

## 2024-05-28 ENCOUNTER — Ambulatory Visit: Admitting: Nurse Practitioner

## 2024-05-28 NOTE — Telephone Encounter (Signed)
 Requested Prescriptions  Pending Prescriptions Disp Refills   amLODipine  (NORVASC ) 5 MG tablet [Pharmacy Med Name: AMLODIPINE  BESYLATE 5 MG TAB] 90 tablet 0    Sig: TAKE 1 TABLET (5 MG TOTAL) BY MOUTH DAILY.     Cardiovascular: Calcium Channel Blockers 2 Failed - 05/28/2024 10:52 AM      Failed - Last BP in normal range    BP Readings from Last 1 Encounters:  04/30/24 (!) 140/98         Passed - Last Heart Rate in normal range    Pulse Readings from Last 1 Encounters:  04/30/24 78         Passed - Valid encounter within last 6 months    Recent Outpatient Visits           4 weeks ago Morbid obesity [facility])   [facility] Gareth Mliss FALCON, FNP   3 years ago Bulging lumbar disc   [facility] [place] Delbert Clam, MD       Future Appointments             In 2 weeks Gareth, Mliss FALCON, FNP [facility] [place]

## 2024-06-11 ENCOUNTER — Ambulatory Visit: Admitting: Nurse Practitioner

## 2024-06-11 ENCOUNTER — Encounter: Payer: Self-pay | Admitting: Nurse Practitioner

## 2024-06-11 VITALS — BP 138/84 | HR 92 | Resp 18 | Ht 69.0 in

## 2024-06-11 DIAGNOSIS — G43009 Migraine without aura, not intractable, without status migrainosus: Secondary | ICD-10-CM

## 2024-06-11 DIAGNOSIS — I10 Essential (primary) hypertension: Secondary | ICD-10-CM | POA: Diagnosis not present

## 2024-06-11 MED ORDER — RIZATRIPTAN BENZOATE 10 MG PO TBDP: 10.0000 mg | ORAL_TABLET | ORAL | 3 refills | Status: AC | PRN

## 2024-06-11 NOTE — Progress Notes (Signed)
 BP 138/84   Pulse 92   Resp 18   Ht 5' 9 (1.753 m)   SpO2 98%   BMI 50.71 kg/m    Subjective:    Patient ID: Frederick Schmidt, male    DOB: Sep 06, 1996, 28 y.o.   MRN: [id number]  HPI: Frederick Schmidt is a 28 y.o. male  Chief Complaint  Patient presents with   Medical Management of Chronic Issues   Obesity   Headache   Hypertension   Discussed the use of AI scribe software for clinical note transcription with the patient, who gave verbal consent to proceed.  History of Present Illness [name] is a 28 year old male with hypertension and obesity who presents with migraine headaches.  Migraine headaches - Migraine headaches present since childhood - Frequency of a couple of episodes per month - Duration ranges from one to several days per episode - Ibuprofen  ineffective for symptom relief - Previous migraine medication containing caffeine provided some relief - Insurance denied coverage for Zavsprat, requiring trial of alternative medications - No recent headaches  Hypertension - Hypertension present for over 1.5 years with multiple elevated blood pressure readings - Initiated on amlodipine  5 mg daily recently - No side effects from antihypertensive medication - Dietary modifications to reduce sodium intake due to craving salty foods  Obesity and weight management - Obesity with previous weight of 343 pounds and BMI of 50.7 - Waist circumference measured at 59 inches at last appointment - Recent weight loss of 8 lbs - Actively attempting weight loss with dietary changes  Sleep disturbance related to supplement use - Uses supplements primarily when going to the gym - Difficulty sleeping when supplements are taken in the evening - Currently takes one supplement during the day and avoids nighttime use to prevent sleep disturbances    Flowsheet Row Office Visit from 04/30/2024 in [facility]  1 59 inches        04/30/2024   10:35 AM   Depression screen PHQ 2/9  Decreased Interest 0  Down, Depressed, Hopeless 0  PHQ - 2 Score 0    Relevant past medical, surgical, family and social history reviewed and updated as indicated. Interim medical history since our last visit reviewed. Allergies and medications reviewed and updated.  Review of Systems Constitutional: Negative for fever or weight change.  Respiratory: Negative for cough and shortness of breath.   Cardiovascular: Negative for chest pain or palpitations.  Gastrointestinal: Negative for abdominal pain, no bowel changes.  Musculoskeletal: Negative for gait problem or joint swelling.  Skin: Negative for rash.  Neurological: Negative for dizziness or headache.  No other specific complaints in a complete review of systems (except as listed in HPI above).      Objective:      BP 138/84   Pulse 92   Resp 18   Ht 5' 9 (1.753 m)   SpO2 98%   BMI 50.71 kg/m    Wt Readings from Last 3 Encounters:  04/30/24 (!) 343 lb 6.4 oz (155.8 kg)  01/01/24 (!) 313 lb 0.9 oz (142 kg)  11/01/23 (!) 315 lb (142.9 kg)    Physical Exam VITALS: BP- 138/84 MEASUREMENTS: Weight- 340. GENERAL: Alert, cooperative, well developed, no acute distress HEENT: Normocephalic, normal oropharynx, moist mucous membranes CHEST: Clear to auscultation bilaterally, No wheezes, rhonchi, or crackles CARDIOVASCULAR: Normal heart rate and rhythm, S1 and S2 normal without murmurs ABDOMEN: Soft, non-tender, non-distended, without organomegaly, Normal bowel sounds EXTREMITIES: No cyanosis or  edema NEUROLOGICAL: Cranial nerves grossly intact, Moves all extremities without gross motor or sensory deficit  Results for orders placed or performed in visit on 04/30/24  CBC with Differential/Platelet   Collection Time: 04/30/24 11:24 AM  Result Value Ref Range   WBC 5.0 3.8 - 10.8 Thousand/uL   RBC 5.23 4.20 - 5.80 Million/uL   Hemoglobin 14.3 13.2 - 17.1 g/dL   HCT 55.1 61.4 - 49.9 %   MCV 85.7  80.0 - 100.0 fL   MCH 27.3 27.0 - 33.0 pg   MCHC 31.9 (L) 32.0 - 36.0 g/dL   RDW 85.6 88.9 - 84.9 %   Platelets 269 140 - 400 Thousand/uL   MPV 10.1 7.5 - 12.5 fL   Neutro Abs 3,155 1,500 - 7,800 cells/uL   Absolute Lymphocytes 1,335 850 - 3,900 cells/uL   Absolute Monocytes 420 200 - 950 cells/uL   Eosinophils Absolute 60 15 - 500 cells/uL   Basophils Absolute 30 0 - 200 cells/uL   Neutrophils Relative % 63.1 %   Total Lymphocyte 26.7 %   Monocytes Relative 8.4 %   Eosinophils Relative 1.2 %   Basophils Relative 0.6 %  Comprehensive metabolic panel with GFR   Collection Time: 04/30/24 11:24 AM  Result Value Ref Range   Glucose, Bld 91 65 - 99 mg/dL   BUN 14 7 - 25 mg/dL   Creat 8.58 (H) 9.39 - 1.26 mg/dL   eGFR 69 > OR = 60 fO/fpw/8.26f7   BUN/Creatinine Ratio 10 6 - 22 (calc)   Sodium 141 135 - 146 mmol/L   Potassium 5.1 3.5 - 5.3 mmol/L   Chloride 107 98 - 110 mmol/L   CO2 27 20 - 32 mmol/L   Calcium 9.4 8.6 - 10.3 mg/dL   Total Protein 6.9 6.1 - 8.1 g/dL   Albumin 4.3 3.6 - 5.1 g/dL   Globulin 2.6 1.9 - 3.7 g/dL (calc)   AG Ratio 1.7 1.0 - 2.5 (calc)   Total Bilirubin 0.5 0.2 - 1.2 mg/dL   Alkaline phosphatase (APISO) 53 36 - 130 U/L   AST 22 10 - 40 U/L   ALT 18 9 - 46 U/L  RPR   Collection Time: 04/30/24 11:24 AM  Result Value Ref Range   RPR Ser Ql REACTIVE (A) NON-REACTIVE  HIV Antibody (routine testing w rflx)   Collection Time: 04/30/24 11:24 AM  Result Value Ref Range   HIV FINAL INTERPRETATION     HIV 1&2 Ab, 4th Generation NON-REACTIVE NON-REACTIVE  Hepatitis C antibody   Collection Time: 04/30/24 11:24 AM  Result Value Ref Range   Hepatitis C Ab NON-REACTIVE NON-REACTIVE  Hemoglobin A1c   Collection Time: 04/30/24 11:24 AM  Result Value Ref Range   Hgb A1c MFr Bld 5.5 <5.7 %   Mean Plasma Glucose 111 mg/dL   eAG (mmol/L) 6.2 mmol/L  Lipid panel   Collection Time: 04/30/24 11:24 AM  Result Value Ref Range   Cholesterol 169 <200 mg/dL   HDL 45 >  OR = 40 mg/dL   Triglycerides 49 <849 mg/dL   LDL Cholesterol (Calc) 110 (H) mg/dL (calc)   Total CHOL/HDL Ratio 3.8 <5.0 (calc)   Non-HDL Cholesterol (Calc) 124 <130 mg/dL (calc)  TSH   Collection Time: 04/30/24 11:24 AM  Result Value Ref Range   TSH 1.11 0.40 - 4.50 mIU/L  Rpr titer   Collection Time: 04/30/24 11:24 AM  Result Value Ref Range   RPR Titer 1:1 (H)   T PALLIDUM AB  Collection Time: 04/30/24 11:24 AM  Result Value Ref Range   T Pallidum Abs POSITIVE (A) NEGATIVE          Assessment & Plan:   Problem List Items Addressed This Visit       Cardiovascular and Mediastinum   Primary hypertension - Primary   Migraine without aura and without status migrainosus, not intractable   Relevant Medications   rizatriptan  (MAXALT -MLT) 10 MG disintegrating tablet     Other   Morbid obesity (HCC)     Assessment and Plan Assessment & Plan Hypertension Hypertension has been present for over a year and a half with multiple elevated readings. Blood pressure today is 138/84, showing improvement. No side effects reported from current medication regimen. - Continue current antihypertensive medication regimen - Schedule follow-up in six months unless blood pressure increases or other symptoms arise  Migraine Chronic migraine headaches since childhood, occurring a couple of times per month and lasting from one to several days. Previous medications, including ibuprofen  and a caffeine-containing medication, provided limited relief. Insurance denied coverage for Zasperat, requiring trial of other medications first. - Prescribe Maxalt  (rizatriptan ) for acute migraine relief, to be taken only during migraine episodes - Instruct him to contact provider via MyChart if issues arise with medication  Obesity Obesity with a weight of 343 pounds and BMI of 50.71 at the last appointment.Waist circumference was 59 inches at the last appointment. He has been trying to lose weight and has  modified diet to reduce sodium intake.  Hyperlipidemia Cholesterol level is elevated, consistent with hyperlipidemia. Current level is 110, with a target of less than 100, ideally closer to 50. A1c is within normal range, indicating no current diabetes. - Encourage dietary modifications to improve cholesterol levels - Monitor cholesterol levels at follow-up appointments        Follow up plan: Return in about 6 months (around 12/09/2024) for follow up.

## 2024-06-14 ENCOUNTER — Other Ambulatory Visit: Payer: Self-pay | Admitting: Nurse Practitioner

## 2024-06-14 DIAGNOSIS — I10 Essential (primary) hypertension: Secondary | ICD-10-CM

## 2024-06-14 NOTE — Telephone Encounter (Signed)
 Copied from CRM #[id number]. Topic: Clinical - Medication Refill >> Jun 14, 2024  3:14 PM [name] wrote: Medication: amLODipine  (NORVASC ) 5 MG tablet  Has the patient contacted their pharmacy? Yes (Agent: If no, request that the patient contact the pharmacy for the refill. If patient does not wish to contact the pharmacy document the reason why and proceed with request.) (Agent: If yes, when and what did the pharmacy advise?)  This is the patient's preferred pharmacy:  [id number] [place], [place] - [address] [place] [address] Phone: [phone] Fax: [phone]  Is this the correct pharmacy for this prescription? Yes If no, delete pharmacy and type the correct one.   Has the prescription been filled recently? Yes  Is the patient out of the medication? Yes  Has the patient been seen for an appointment in the last year OR does the patient have an upcoming appointment? Yes  Can we respond through MyChart? No  Agent: Please be advised that Rx refills may take up to 3 business days. We ask that you follow-up with your pharmacy.

## 2024-06-17 NOTE — Telephone Encounter (Signed)
 Duplicate request, LRF 05/28/24 for 90 days.E-Prescribing Status: Receipt confirmed by pharmacy (05/28/2024 10:52 AM .  Requested Prescriptions  Pending Prescriptions Disp Refills   amLODipine  (NORVASC ) 5 MG tablet 90 tablet 0    Sig: Take 1 tablet (5 mg total) by mouth daily.     Cardiovascular: Calcium Channel Blockers 2 Passed - 06/17/2024  8:44 AM      Passed - Last BP in normal range    BP Readings from Last 1 Encounters:  06/11/24 138/84         Passed - Last Heart Rate in normal range    Pulse Readings from Last 1 Encounters:  06/11/24 92         Passed - Valid encounter within last 6 months    Recent Outpatient Visits           6 days ago Primary hypertension   [facility] Gareth Mliss FALCON, FNP   1 month ago Morbid obesity [facility])   [facility] Gareth Mliss FALCON, FNP   3 years ago Bulging lumbar disc   [facility] [place] Delbert Clam, MD

## 2024-06-18 ENCOUNTER — Other Ambulatory Visit (HOSPITAL_COMMUNITY): Payer: Self-pay

## 2024-06-18 ENCOUNTER — Telehealth: Payer: Self-pay

## 2024-06-18 ENCOUNTER — Other Ambulatory Visit: Payer: Self-pay

## 2024-06-18 DIAGNOSIS — I10 Essential (primary) hypertension: Secondary | ICD-10-CM

## 2024-06-18 MED ORDER — AMLODIPINE BESYLATE 5 MG PO TABS
5.0000 mg | ORAL_TABLET | Freq: Every day | ORAL | 0 refills | Status: DC
  Filled 2024-06-18: qty 90, 90d supply, fill #0

## 2024-06-18 MED ORDER — AMLODIPINE BESYLATE 5 MG PO TABS
5.0000 mg | ORAL_TABLET | Freq: Every day | ORAL | 0 refills | Status: AC
  Filled 2024-06-18: qty 90, 90d supply, fill #0

## 2024-06-18 NOTE — Addendum Note (Signed)
 Addended by: YVONE WARREN BROCKS on: 06/18/2024 02:37 PM   Modules accepted: Orders

## 2024-06-18 NOTE — Telephone Encounter (Signed)
 Copied from CRM #[id number]. Topic: Clinical - Prescription Issue >> Jun 18, 2024  1:08 PM Avram MATSU wrote: Reason for CRM: patient called and stated the pharmacy told him to get his medication amLODipine  (NORVASC ) 5 MG tablet [[id number]] to be [facility] at [facility] because [facility] not [facility] to [facility]. Also the provider needs [facility] to [facility] to medication over.  Please advise [phone] (M)

## 2024-06-24 ENCOUNTER — Other Ambulatory Visit (HOSPITAL_COMMUNITY): Payer: Self-pay

## 2024-12-09 ENCOUNTER — Ambulatory Visit: Admitting: Nurse Practitioner
